# Patient Record
Sex: Male | Born: 1985 | Race: White | Hispanic: No | Marital: Married | Smoking: Current every day smoker
Health system: Southern US, Community
[De-identification: ages and names within clinical notes are randomized; demographics above are authoritative.]

## PROBLEM LIST (undated history)

## (undated) DIAGNOSIS — J45909 Unspecified asthma, uncomplicated: Secondary | ICD-10-CM

## (undated) DIAGNOSIS — I499 Cardiac arrhythmia, unspecified: Secondary | ICD-10-CM

## (undated) DIAGNOSIS — G8929 Other chronic pain: Secondary | ICD-10-CM

## (undated) DIAGNOSIS — G2581 Restless legs syndrome: Secondary | ICD-10-CM

## (undated) DIAGNOSIS — M549 Dorsalgia, unspecified: Secondary | ICD-10-CM

## (undated) HISTORY — PX: KNEE SURGERY: SHX244

## (undated) HISTORY — PX: OTHER SURGICAL HISTORY: SHX169

---
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
## 2008-10-29 ENCOUNTER — Emergency Department (HOSPITAL_COMMUNITY): Admission: EM | Admit: 2008-10-29 | Discharge: 2008-10-29 | Payer: Self-pay | Admitting: Emergency Medicine

## 2012-11-07 ENCOUNTER — Emergency Department (HOSPITAL_COMMUNITY): Payer: Self-pay

## 2012-11-07 ENCOUNTER — Emergency Department (HOSPITAL_COMMUNITY)
Admission: EM | Admit: 2012-11-07 | Discharge: 2012-11-07 | Disposition: A | Discharge status: Home / Self Care | Payer: Self-pay | Attending: Emergency Medicine | Admitting: Emergency Medicine

## 2012-11-07 ENCOUNTER — Encounter (HOSPITAL_COMMUNITY): Payer: Self-pay | Admitting: *Deleted

## 2012-11-07 DIAGNOSIS — S298XXA Other specified injuries of thorax, initial encounter: Secondary | ICD-10-CM | POA: Insufficient documentation

## 2012-11-07 DIAGNOSIS — S0083XA Contusion of other part of head, initial encounter: Secondary | ICD-10-CM

## 2012-11-07 DIAGNOSIS — S022XXA Fracture of nasal bones, initial encounter for closed fracture: Secondary | ICD-10-CM | POA: Insufficient documentation

## 2012-11-07 DIAGNOSIS — S0003XA Contusion of scalp, initial encounter: Secondary | ICD-10-CM | POA: Insufficient documentation

## 2012-11-07 HISTORY — DX: Unspecified asthma, uncomplicated: J45.909

## 2012-11-07 MED ORDER — HYDROCODONE-ACETAMINOPHEN 5-325 MG PO TABS
1.0000 | ORAL_TABLET | Freq: Once | ORAL | Status: AC
  Administered 2012-11-07: 1 via ORAL
  Filled 2012-11-07: qty 1

## 2012-11-07 MED ORDER — HYDROCODONE-ACETAMINOPHEN 5-325 MG PO TABS: 2.0000 | ORAL_TABLET | ORAL | Status: DC | PRN

## 2012-11-07 MED ORDER — NAPROXEN 375 MG PO TABS: 375.0000 mg | ORAL_TABLET | Freq: Two times a day (BID) | ORAL | Status: DC
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
## 2012-11-07 NOTE — ED Notes (Signed)
Pt back from xray, dressed self. Re-placing jewelry back in piercing holes. Ambulatory in room.
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
## 2012-11-07 NOTE — ED Notes (Signed)
Pt to xray via wc

## 2013-01-31 ENCOUNTER — Emergency Department (HOSPITAL_COMMUNITY)
Admission: EM | Admit: 2013-01-31 | Discharge: 2013-01-31 | Disposition: A | Discharge status: Home / Self Care | Payer: Self-pay | Attending: Emergency Medicine | Admitting: Emergency Medicine

## 2013-01-31 ENCOUNTER — Encounter (HOSPITAL_COMMUNITY): Payer: Self-pay

## 2013-01-31 DIAGNOSIS — J45909 Unspecified asthma, uncomplicated: Secondary | ICD-10-CM | POA: Insufficient documentation

## 2013-01-31 DIAGNOSIS — IMO0002 Reserved for concepts with insufficient information to code with codable children: Secondary | ICD-10-CM | POA: Insufficient documentation

## 2013-01-31 DIAGNOSIS — Y9289 Other specified places as the place of occurrence of the external cause: Secondary | ICD-10-CM | POA: Insufficient documentation

## 2013-01-31 DIAGNOSIS — S3992XA Unspecified injury of lower back, initial encounter: Secondary | ICD-10-CM

## 2013-01-31 DIAGNOSIS — R209 Unspecified disturbances of skin sensation: Secondary | ICD-10-CM | POA: Insufficient documentation

## 2013-01-31 DIAGNOSIS — X503XXA Overexertion from repetitive movements, initial encounter: Secondary | ICD-10-CM | POA: Insufficient documentation

## 2013-01-31 DIAGNOSIS — Y93H1 Activity, digging, shoveling and raking: Secondary | ICD-10-CM | POA: Insufficient documentation

## 2013-01-31 DIAGNOSIS — F17200 Nicotine dependence, unspecified, uncomplicated: Secondary | ICD-10-CM | POA: Insufficient documentation

## 2013-01-31 MED ORDER — CYCLOBENZAPRINE HCL 10 MG PO TABS: 10.0000 mg | ORAL_TABLET | Freq: Two times a day (BID) | ORAL | Status: DC | PRN

## 2013-01-31 MED ORDER — TRAMADOL HCL 50 MG PO TABS: 50.0000 mg | ORAL_TABLET | Freq: Four times a day (QID) | ORAL | Status: DC | PRN

## 2013-01-31 NOTE — ED Provider Notes (Signed)
History     CSN: [id number]  Arrival date & time 01/31/13  1722   First MD Initiated Contact with Patient 01/31/13 1801      Chief Complaint  Patient presents with  . Back Pain    (Consider location/radiation/quality/duration/timing/severity/associated sxs/prior treatment) Patient is a 27 y.o. male presenting with back pain. The history is provided by the patient.  Back Pain Location:  Lumbar spine Radiates to:  R thigh Pain severity:  Severe Pain is:  Same all the time Onset quality:  Sudden Duration:  9 days Timing:  Intermittent Progression:  Worsening Chronicity:  New Context: physical stress   Context comment:  Digging a drain  Relieved by:  Nothing Worsened by:  Ambulation, movement and twisting Ineffective treatments:  NSAIDs Associated symptoms: tingling   Associated symptoms: no abdominal pain, no bladder incontinence, no bowel incontinence, no chest pain, no fever, no perianal numbness and no weakness    Oscar Anderson is a 27 y.o. male who presents to the ED with low back pain. The pain started over a week ago after digging a ditch for a drain pipe. The pain radiates to the right buttock and right leg.  Past Medical History  Diagnosis Date  . Asthma     History reviewed. No pertinent past surgical history.  No family history on file.  History  Substance Use Topics  . Smoking status: Current Every Day Smoker    Types: Cigarettes  . Smokeless tobacco: Not on file  . Alcohol Use: Yes     Comment: occ      Review of Systems  Constitutional: Negative for fever and chills.  HENT: Negative for neck pain.   Respiratory: Negative for shortness of breath.   Cardiovascular: Negative for chest pain.  Gastrointestinal: Negative for abdominal pain and bowel incontinence.  Genitourinary: Negative for bladder incontinence.  Musculoskeletal: Positive for back pain.  Skin: Negative for wound.  Neurological: Positive for tingling. Negative for weakness.   Psychiatric/Behavioral: The patient is not nervous/anxious.     Allergies  Review of patient's allergies indicates no known allergies.  Home Medications   Current Outpatient Rx  Name  Route  Sig  Dispense  Refill  . ibuprofen (ADVIL,MOTRIN) 200 MG tablet   Oral   Take 200 mg by mouth every 6 (six) hours as needed for pain.           BP 129/77  Pulse 95  Temp(Src) 97.9 F (36.6 C) (Oral)  Resp 20  Ht 5\' 7"  (1.702 m)  Wt 167 lb (75.751 kg)  BMI 26.15 kg/m2  SpO2 100%  Physical Exam  Nursing note and vitals reviewed. Constitutional: He is oriented to person, place, and time. He appears well-developed and well-nourished. No distress.  HENT:  Head: Normocephalic and atraumatic.  Eyes: Conjunctivae and EOM are normal.  Neck: Normal range of motion. Neck supple.  Cardiovascular: Normal rate, regular rhythm and normal heart sounds.   Pulmonary/Chest: Effort normal and breath sounds normal. He has no wheezes.  Abdominal: Soft. There is no tenderness.  Musculoskeletal:       Lumbar back: He exhibits decreased range of motion, tenderness and spasm.       Back:  Tender with palpation right lower lumbar area and right sciatic nerve. Pain radiates to right upper leg.   Neurological: He is alert and oriented to person, place, and time. He has normal strength and normal reflexes. No cranial nerve deficit or sensory deficit.  Radial and pedal pulses strong  and equal bilateral. Good touch sensation, adequate circulation. Ambulatory without difficulty.  Skin: Skin is warm and dry.  Psychiatric: He has a normal mood and affect. His behavior is normal.    ED Course  Procedures (including critical care time)  Assessment: 27 y.o. male with low back pain   Lumbar strain, muscle spasm  Plan:  Muscle relaxant, pain management, follow up with ortho prn.  MDM  Discussed with the patient clinical findings and plan of care and all questioned fully answered. He will return if any problems  arise.    Medication List    TAKE these medications       cyclobenzaprine 10 MG tablet  Commonly known as:  FLEXERIL  Take 1 tablet (10 mg total) by mouth 2 (two) times daily as needed for muscle spasms.     traMADol 50 MG tablet  Commonly known as:  ULTRAM  Take 1 tablet (50 mg total) by mouth every 6 (six) hours as needed for pain.      ASK your doctor about these medications       ibuprofen 200 MG tablet  Commonly known as:  ADVIL,MOTRIN  Take 200 mg by mouth every 6 (six) hours as needed for pain.               Janne Napoleon, [place] 01/31/13 [phone]

## 2013-01-31 NOTE — ED Notes (Signed)
Pt reports low back pain for 1 week, describes as hot lava running down his back and into right leg

## 2013-02-03 NOTE — ED Provider Notes (Signed)
Medical screening examination/treatment/procedure(s) were performed by non-physician practitioner and as supervising physician I was immediately available for consultation/collaboration.  Raeford Razor, MD 02/03/13 [phone]

## 2013-06-18 ENCOUNTER — Encounter (HOSPITAL_COMMUNITY): Payer: Self-pay | Admitting: Emergency Medicine

## 2013-06-18 ENCOUNTER — Emergency Department (HOSPITAL_COMMUNITY)
Admission: EM | Admit: 2013-06-18 | Discharge: 2013-06-18 | Disposition: A | Discharge status: Home / Self Care | Payer: Self-pay | Attending: Emergency Medicine | Admitting: Emergency Medicine

## 2013-06-18 DIAGNOSIS — IMO0002 Reserved for concepts with insufficient information to code with codable children: Secondary | ICD-10-CM | POA: Insufficient documentation

## 2013-06-18 DIAGNOSIS — M549 Dorsalgia, unspecified: Secondary | ICD-10-CM

## 2013-06-18 DIAGNOSIS — M545 Low back pain, unspecified: Secondary | ICD-10-CM | POA: Insufficient documentation

## 2013-06-18 DIAGNOSIS — F17200 Nicotine dependence, unspecified, uncomplicated: Secondary | ICD-10-CM | POA: Insufficient documentation

## 2013-06-18 DIAGNOSIS — R2232 Localized swelling, mass and lump, left upper limb: Secondary | ICD-10-CM

## 2013-06-18 DIAGNOSIS — J45909 Unspecified asthma, uncomplicated: Secondary | ICD-10-CM | POA: Insufficient documentation

## 2013-06-18 MED ORDER — CYCLOBENZAPRINE HCL 5 MG PO TABS: 5.0000 mg | ORAL_TABLET | Freq: Three times a day (TID) | ORAL | Status: DC | PRN

## 2013-06-18 MED ORDER — CEPHALEXIN 500 MG PO CAPS: 500.0000 mg | ORAL_CAPSULE | Freq: Four times a day (QID) | ORAL | Status: DC

## 2013-06-18 MED ORDER — HYDROCODONE-ACETAMINOPHEN 5-325 MG PO TABS
1.0000 | ORAL_TABLET | Freq: Once | ORAL | Status: AC
  Administered 2013-06-18: 1 via ORAL
  Filled 2013-06-18: qty 1

## 2013-06-18 MED ORDER — HYDROCODONE-ACETAMINOPHEN 5-325 MG PO TABS: 1.0000 | ORAL_TABLET | ORAL | Status: DC | PRN

## 2013-06-18 NOTE — ED Provider Notes (Signed)
CSN: [id number]     Arrival date & time 06/18/13  1531 History   This chart was scribed for non-physician practitioner Coral Ceo, PA-C working with Lyanne Co, MD by Joaquin Music and Dorothey Baseman, ED Scribe. This patient was seen in room WTR7/WTR7 and the patient's care was started at 5:31 PM .   Chief Complaint  Patient presents with  . Back Pain  . Abscess   The history is provided by the patient. No language interpreter was used.   HPI Comments: Oscar Anderson is a 27 y.o. male who presents to the Emergency Department complaining of back pain and an abscess.  His back pain has been present for two weeks after removing drain pipes from the ground two weeks ago at work.  No trauma or injuries.  His pain is located in his lower back bilaterally.  His pain radiates down his legs bilaterally.  His pain is gradually worsening.  He states he has had similar pain when he pulled a muscle in his back two years ago.  He describes it has a burning pain worse with movement.  He has been trying warm compresses and Tylenol with no relief.  He denies any loss of bowel/bladder function, numbness, loss of sensation, or weakness.  He is worried about his pain interfering with his job.  Pt also complains of abscess under left axilla onset 6 days. Pt states abscess seems to be growing. Pt denies any wounds in area prior to abscess. Pt denies being diabetic. He denies abscess drainage. Pt states this is the first abscess he has ever had.  He denies any lacerations to his arms or recent tattoos.  He otherwise has been well with no recent fevers, chills, change in appetite/activity, rhinorrhea, congestion, sore throat, chest pain, SOB, abdominal pain, nausea, emesis, dysuria, headache, dizziness, or lightheadedness.     Past Medical History  Diagnosis Date  . Asthma    History reviewed. No pertinent past surgical history. No family history on file. History  Substance Use Topics  . Smoking  status: Current Every Day Smoker    Types: Cigarettes  . Smokeless tobacco: Not on file  . Alcohol Use: Yes     Comment: occ    Review of Systems  Constitutional: Negative for fever, chills, activity change, appetite change and fatigue.  HENT: Negative for ear pain, congestion, sore throat, rhinorrhea, neck pain and neck stiffness.   Eyes: Negative for visual disturbance.  Respiratory: Negative for cough and shortness of breath.   Cardiovascular: Negative for chest pain and leg swelling.  Gastrointestinal: Negative for nausea, vomiting, abdominal pain, diarrhea and constipation.  Genitourinary: Negative for dysuria and hematuria.  Musculoskeletal: Positive for back pain. Negative for myalgias, joint swelling, arthralgias and gait problem.  Skin: Negative for rash and wound.  Neurological: Negative for dizziness, syncope, weakness, light-headedness, numbness and headaches.  Psychiatric/Behavioral: Negative for confusion.    Allergies  Flexeril and Tramadol  Home Medications   Current Outpatient Rx  Name  Route  Sig  Dispense  Refill  . acetaminophen (TYLENOL) 500 MG tablet   Oral   Take 1,000 mg by mouth every 6 (six) hours as needed for pain.          BP 130/86  Pulse 68  Temp(Src) 97.5 F (36.4 C) (Oral)  Resp 18  SpO2 100%  Filed Vitals:   06/18/13 1619  BP: 130/86  Pulse: 68  Temp: 97.5 F (36.4 C)  TempSrc: Oral  Resp: 18  SpO2: 100%     Physical Exam  Nursing note and vitals reviewed. Constitutional: He is oriented to person, place, and time. He appears well-developed and well-nourished. No distress.  HENT:  Head: Normocephalic and atraumatic.  Right Ear: External ear normal.  Left Ear: External ear normal.  Nose: Nose normal.  Eyes: Conjunctivae and EOM are normal. Right eye exhibits no discharge. Left eye exhibits no discharge.  Neck: Normal range of motion. Neck supple. No tracheal deviation present.  No cervical spinal or paraspinal tenderness.    Cardiovascular: Normal rate, regular rhythm, normal heart sounds and intact distal pulses.  Exam reveals no gallop and no friction rub.   No murmur heard. Radial and dorsalis pedis pulse present bilaterally  Pulmonary/Chest: Effort normal and breath sounds normal. No respiratory distress. He has no wheezes. He has no rales.  Abdominal: Soft. Bowel sounds are normal. He exhibits no distension. There is no tenderness.  Musculoskeletal: Normal range of motion. He exhibits no edema and no tenderness.  Tenderness to palpation to the lower paraspinal muscles with no lumbar or thoracic spinal tenderness. Patient able to ambulate without difficulty or ataxia. Strength 5/5 in the UE and LE bilaterally  Neurological: He is alert and oriented to person, place, and time.  Sensation intact in the LE bilaterally. Patellar reflexes intact bilaterally  Skin: Skin is warm and dry. He is not diaphoretic.  2 cm x 1 cm fluctuant mobile circular mass in the left axilla with no overlying erythema, wounds, or ecchymosis. No wounds present to the left arm throughout  Psychiatric: He has a normal mood and affect. His behavior is normal.    ED Course  Procedures  DIAGNOSTIC STUDIES: Oxygen Saturation is 100% on RA, normal by my interpretation.    COORDINATION OF CARE: 5:36 PM-Discussed treatment plan which includes prescribing pain medication. Will do further evaluation with ultrasound.  with pt at bedside and pt agreed to plan.   5:40 PM-Ultrasound to left axilla. Appears to be an abscess however it is not definitive.    5:45 PM-Administer pain medication.  INCISION AND DRAINAGE Performed by: Coral Ceo, PA-C Consent: Verbal consent obtained. Risks and benefits: risks, benefits and alternatives were discussed Type: abscess Body area: left axilla  Anesthesia: local infiltration Incision was made with a scalpel. Local anesthetic: lidocaine 1% without epinephrine Anesthetic total: 1 ml Complexity:  simple Blunt dissection to break up loculations Drainage: N/A Drainage amount: None Patient tolerance: Patient tolerated the procedure well with no immediate complications.   Labs Review Labs Reviewed - No data to display Imaging Review No results found.  MDM  No diagnosis found.   Oscar Anderson is a 28 y.o. male who presents to the Emergency Department complaining of back pain and an abscess. Vicodin ordered for pain.      Etiology of back pain is possibly due to a muscular strain.  Patient was neurovascularly intact.  Patient was prescribed Flexeril for outpatient management.  He was instructed not to drive, drink alcohol, or operate machinery while taking this medication. Etiology of axillary mass is an enlarged lymph node vs abscess.  Bedside US performed and did not definitively differentiate. Patient was prescribed Keflex and vicodin for further management.  Patient was provided resources and instructed to follow-up with a PCP in two days for recheck of his mass and further management of his back pain.  He was instructed to keep the area clean and dry.  Patient was afebrile and remained in no acute distress throughout  his ED visit.  Patient was instructed to return to the ED if they experience any fever, spreading edema/erythema, weakness, loss of bowel/bladder function, or other concerns.  Patient was in agreement with discharge and plan.  Step dad in ED driving him home.     Final impressions: 1. Back pain  2. Mass left axilla     Luiz Iron PA-C   This patient was discussed with Dr. Gwendolyn Grant    I personally performed the services described in this documentation, which was scribed in my presence. The recorded information has been reviewed and is accurate.    Jillyn Ledger, PA-C 06/19/13 2020

## 2013-06-18 NOTE — ED Notes (Signed)
Pt presents with c/o of back pain that began two weeks ago due to removing drain pipes out of the ground. Pt reports the pain radiates down the right leg. Pt also c/o of left axilla abscess. Pt is A/O x4 and in NAD.

## 2013-06-20 NOTE — ED Provider Notes (Signed)
Medical screening examination/treatment/procedure(s) were performed by non-physician practitioner and as supervising physician I was immediately available for consultation/collaboration.  Arnie Clingenpeel M Kain Milosevic, MD 06/20/13 1510 

## 2013-09-02 ENCOUNTER — Emergency Department (HOSPITAL_COMMUNITY): Payer: Self-pay

## 2013-09-02 ENCOUNTER — Emergency Department (HOSPITAL_COMMUNITY)
Admission: EM | Admit: 2013-09-02 | Discharge: 2013-09-03 | Disposition: A | Discharge status: Home / Self Care | Payer: Self-pay | Attending: Emergency Medicine | Admitting: Emergency Medicine

## 2013-09-02 ENCOUNTER — Encounter (HOSPITAL_COMMUNITY): Payer: Self-pay | Admitting: Emergency Medicine

## 2013-09-02 DIAGNOSIS — R109 Unspecified abdominal pain: Secondary | ICD-10-CM

## 2013-09-02 DIAGNOSIS — F17200 Nicotine dependence, unspecified, uncomplicated: Secondary | ICD-10-CM | POA: Insufficient documentation

## 2013-09-02 DIAGNOSIS — R1031 Right lower quadrant pain: Secondary | ICD-10-CM | POA: Insufficient documentation

## 2013-09-02 DIAGNOSIS — R112 Nausea with vomiting, unspecified: Secondary | ICD-10-CM | POA: Insufficient documentation

## 2013-09-02 DIAGNOSIS — J45909 Unspecified asthma, uncomplicated: Secondary | ICD-10-CM | POA: Insufficient documentation

## 2013-09-02 LAB — CBC WITH DIFFERENTIAL/PLATELET
Basophils Relative: 0 % (ref 0–1)
Eosinophils Absolute: 0.1 10*3/uL (ref 0.0–0.7)
Eosinophils Relative: 0 % (ref 0–5)
HCT: 42.9 % (ref 39.0–52.0)
Hemoglobin: 15.5 g/dL (ref 13.0–17.0)
MCH: 31.6 pg (ref 26.0–34.0)
MCHC: 36.1 g/dL — ABNORMAL HIGH (ref 30.0–36.0)
Monocytes Absolute: 1.1 10*3/uL — ABNORMAL HIGH (ref 0.1–1.0)
Monocytes Relative: 9 % (ref 3–12)
RDW: 12.2 % (ref 11.5–15.5)

## 2013-09-02 LAB — URINALYSIS, ROUTINE W REFLEX MICROSCOPIC
Glucose, UA: NEGATIVE mg/dL
Leukocytes, UA: NEGATIVE
Protein, ur: NEGATIVE mg/dL
Specific Gravity, Urine: 1.019 (ref 1.005–1.030)
pH: 6 (ref 5.0–8.0)

## 2013-09-02 LAB — COMPREHENSIVE METABOLIC PANEL
Albumin: 4.7 g/dL (ref 3.5–5.2)
BUN: 14 mg/dL (ref 6–23)
Calcium: 9.4 mg/dL (ref 8.4–10.5)
Creatinine, Ser: 0.93 mg/dL (ref 0.50–1.35)
Total Bilirubin: 0.3 mg/dL (ref 0.3–1.2)
Total Protein: 7.6 g/dL (ref 6.0–8.3)

## 2013-09-02 MED ORDER — SODIUM CHLORIDE 0.9 % IV BOLUS (SEPSIS)
1000.0000 mL | Freq: Once | INTRAVENOUS | Status: AC
  Administered 2013-09-02: 1000 mL via INTRAVENOUS

## 2013-09-02 MED ORDER — ONDANSETRON HCL 4 MG/2ML IJ SOLN
4.0000 mg | Freq: Once | INTRAMUSCULAR | Status: AC
  Administered 2013-09-02: 4 mg via INTRAVENOUS
  Filled 2013-09-02: qty 2

## 2013-09-02 MED ORDER — HYDROMORPHONE HCL PF 1 MG/ML IJ SOLN
1.0000 mg | Freq: Once | INTRAMUSCULAR | Status: AC
  Administered 2013-09-02: 1 mg via INTRAVENOUS
  Filled 2013-09-02: qty 1

## 2013-09-02 MED ORDER — IOHEXOL 300 MG/ML  SOLN
20.0000 mL | INTRAMUSCULAR | Status: AC
  Administered 2013-09-02: 20 mL via ORAL
  Administered 2013-09-02: 25 mL via ORAL

## 2013-09-02 MED ORDER — IOHEXOL 300 MG/ML  SOLN
100.0000 mL | Freq: Once | INTRAMUSCULAR | Status: AC | PRN
  Administered 2013-09-02: 100 mL via INTRAVENOUS

## 2013-09-02 NOTE — ED Notes (Signed)
Pt states he is having lower right abd pain that is made worse with palpation.  Pt states pain is made worse with urination.

## 2013-09-02 NOTE — ED Provider Notes (Signed)
CSN: [id number]     Arrival date & time 09/02/13  1847 History   First MD Initiated Contact with Patient 09/02/13 2100     Chief Complaint  Patient presents with  . Abdominal Pain   (Consider location/radiation/quality/duration/timing/severity/associated sxs/prior Treatment) Patient is a 27 y.o. male presenting with abdominal pain.  Abdominal Pain Associated symptoms: nausea and vomiting   Associated symptoms: no chills, no constipation, no cough, no diarrhea, no dysuria, no fever, no hematuria and no sore throat    Oscar Anderson is a 27 y.o. male who presents to the emergency department for concern of abdominal pain.  R sided.  Located in RLQ.  Radiates towards R back.  Started two days ago.  Worse with palpation.  Better with nothing.  Associated with some dark urine and subjective fevers. One episode of NBNB vomiting.  No diarrhea/constipation.  No other symptoms.  Past Medical History  Diagnosis Date  . Asthma    History reviewed. No pertinent past surgical history. History reviewed. No pertinent family history. History  Substance Use Topics  . Smoking status: Current Every Day Smoker -- 0.50 packs/day    Types: Cigarettes  . Smokeless tobacco: Not on file  . Alcohol Use: No     Comment: occ    Review of Systems  Constitutional: Negative for fever and chills.  HENT: Negative for congestion and sore throat.   Respiratory: Negative for cough.   Gastrointestinal: Positive for nausea, vomiting and abdominal pain. Negative for diarrhea and constipation.  Endocrine: Negative for polyuria.  Genitourinary: Negative for dysuria and hematuria.  Musculoskeletal: Negative for neck pain.  Skin: Negative for rash.  Neurological: Negative for headaches.  Psychiatric/Behavioral: Negative.   All other systems reviewed and are negative.    Allergies  Flexeril and Tramadol  Home Medications  No current outpatient prescriptions on file. BP 123/71  Pulse 67  Temp(Src)  98.5 F (36.9 C) (Oral)  Resp 17  Wt 142 lb 4 oz (64.524 kg)  SpO2 99% Physical Exam  Nursing note and vitals reviewed. Constitutional: He is oriented to person, place, and time. He appears well-developed and well-nourished. No distress.  HENT:  Head: Normocephalic and atraumatic.  Right Ear: External ear normal.  Left Ear: External ear normal.  Mouth/Throat: Oropharynx is clear and moist. No oropharyngeal exudate.  Eyes: Conjunctivae are normal. Pupils are equal, round, and reactive to light. Right eye exhibits no discharge.  Neck: Normal range of motion. Neck supple. No tracheal deviation present.  Cardiovascular: Normal rate, regular rhythm and intact distal pulses.   Pulmonary/Chest: Effort normal. No respiratory distress. He has no wheezes. He has no rales.  Abdominal: Soft. He exhibits no distension. There is no tenderness. There is no rebound and no guarding.  Musculoskeletal: Normal range of motion.  Neurological: He is alert and oriented to person, place, and time.  Skin: Skin is warm and dry. No rash noted. He is not diaphoretic.  Psychiatric: He has a normal mood and affect.    ED Course  Procedures (including critical care time) Labs Review Labs Reviewed  CBC WITH DIFFERENTIAL - Abnormal; Notable for the following:    WBC 11.4 (*)    MCHC 36.1 (*)    Monocytes Absolute 1.1 (*)    All other components within normal limits  COMPREHENSIVE METABOLIC PANEL  URINALYSIS, ROUTINE W REFLEX MICROSCOPIC   Imaging Review No results found.  EKG Interpretation   None       MDM   1. Abdominal pain  Oscar Anderson is a 27 y.o. male who presents to the emergency department for concern of R side abdominal pain.  Pain controlled.  Basic labs with mild leukocytosis.  CT scan performed and negative for acute process.  Patient reevaluated and with improvement in abdominal pain.  Doubt biliary cause of pain.  Patient safe for discharge home.  Patient  discharged.    Arloa Koh, MD 09/02/13 [phone]

## 2013-09-03 NOTE — ED Notes (Signed)
Pt ambulating all over room, upset that dr did not prescribe pain medication. Explained to pt that all scans and testing was negative and it was recommended that he follow up with a primary care physician for further evaluation. Pt states understanding but still upset.

## 2013-09-03 NOTE — ED Provider Notes (Signed)
I saw and evaluated the patient, reviewed the resident's note and I agree with the findings and plan.  EKG Interpretation   None        Patient here with abdominal pain. RLQ pain on exam, no rebound, guarding. Will CT. CT negative. Stable for discharge.  Dagmar Hait, MD 09/03/13 [phone]

## 2014-06-27 ENCOUNTER — Encounter (HOSPITAL_BASED_OUTPATIENT_CLINIC_OR_DEPARTMENT_OTHER): Payer: Self-pay | Admitting: Emergency Medicine

## 2014-06-27 ENCOUNTER — Emergency Department (HOSPITAL_BASED_OUTPATIENT_CLINIC_OR_DEPARTMENT_OTHER): Payer: Self-pay

## 2014-06-27 ENCOUNTER — Emergency Department (HOSPITAL_BASED_OUTPATIENT_CLINIC_OR_DEPARTMENT_OTHER)
Admission: EM | Admit: 2014-06-27 | Discharge: 2014-06-27 | Disposition: A | Discharge status: Home / Self Care | Payer: Self-pay | Attending: Emergency Medicine | Admitting: Emergency Medicine

## 2014-06-27 DIAGNOSIS — R0789 Other chest pain: Secondary | ICD-10-CM | POA: Insufficient documentation

## 2014-06-27 DIAGNOSIS — J45909 Unspecified asthma, uncomplicated: Secondary | ICD-10-CM | POA: Insufficient documentation

## 2014-06-27 DIAGNOSIS — Z720 Tobacco use: Secondary | ICD-10-CM | POA: Insufficient documentation

## 2014-06-27 LAB — URINALYSIS, ROUTINE W REFLEX MICROSCOPIC
Bilirubin Urine: NEGATIVE
GLUCOSE, UA: NEGATIVE mg/dL
HGB URINE DIPSTICK: NEGATIVE
KETONES UR: NEGATIVE mg/dL
LEUKOCYTES UA: NEGATIVE
Nitrite: NEGATIVE
PH: 6.5 (ref 5.0–8.0)
PROTEIN: NEGATIVE mg/dL
Specific Gravity, Urine: 1.003 — ABNORMAL LOW (ref 1.005–1.030)
Urobilinogen, UA: 0.2 mg/dL (ref 0.0–1.0)

## 2014-06-27 LAB — CBC
HEMATOCRIT: 43.9 % (ref 39.0–52.0)
HEMOGLOBIN: 15.5 g/dL (ref 13.0–17.0)
MCH: 30.8 pg (ref 26.0–34.0)
MCHC: 35.3 g/dL (ref 30.0–36.0)
MCV: 87.1 fL (ref 78.0–100.0)
Platelets: 321 10*3/uL (ref 150–400)
RBC: 5.04 MIL/uL (ref 4.22–5.81)
RDW: 11.8 % (ref 11.5–15.5)
WBC: 7.7 10*3/uL (ref 4.0–10.5)

## 2014-06-27 LAB — BASIC METABOLIC PANEL
ANION GAP: 13 (ref 5–15)
BUN: 8 mg/dL (ref 6–23)
CALCIUM: 9.6 mg/dL (ref 8.4–10.5)
CHLORIDE: 102 meq/L (ref 96–112)
CO2: 27 meq/L (ref 19–32)
Creatinine, Ser: 0.8 mg/dL (ref 0.50–1.35)
GFR calc Af Amer: >90 mL/min (ref >90)
GFR calc non Af Amer: >90 mL/min (ref >90)
GLUCOSE: 93 mg/dL (ref 70–99)
Potassium: 4.3 mEq/L (ref 3.7–5.3)
SODIUM: 142 meq/L (ref 137–147)

## 2014-06-27 LAB — TROPONIN I: Troponin I: <0.3 ng/mL (ref <0.30)

## 2014-06-27 MED ORDER — HYDROCODONE-ACETAMINOPHEN 5-325 MG PO TABS: 1.0000 | ORAL_TABLET | Freq: Four times a day (QID) | ORAL | Status: DC | PRN

## 2014-06-27 MED ORDER — LORAZEPAM 1 MG PO TABS: 1.0000 mg | ORAL_TABLET | Freq: Three times a day (TID) | ORAL | Status: DC | PRN

## 2014-06-27 MED ORDER — IBUPROFEN 800 MG PO TABS: 800.0000 mg | ORAL_TABLET | Freq: Three times a day (TID) | ORAL | Status: DC

## 2014-06-27 MED ORDER — HYDROCODONE-ACETAMINOPHEN 5-325 MG PO TABS
1.0000 | ORAL_TABLET | Freq: Once | ORAL | Status: AC
  Administered 2014-06-27: 1 via ORAL
  Filled 2014-06-27: qty 1

## 2014-06-27 MED ORDER — LORAZEPAM 1 MG PO TABS
1.0000 mg | ORAL_TABLET | Freq: Once | ORAL | Status: AC
  Administered 2014-06-27: 1 mg via ORAL
  Filled 2014-06-27: qty 1

## 2014-06-27 NOTE — Discharge Instructions (Signed)
Please follow up with your primary care physician in 1-2 days. If you do not have one please call the [facility] number listed above. Please take pain medication and/or muscle relaxants as prescribed and as needed for pain. Please do not drive on narcotic pain medication or on muscle relaxants. Please read all discharge instructions and return precautions.    Chest Wall Pain Chest wall pain is pain in or around the bones and muscles of your chest. It may take up to 6 weeks to get better. It may take longer if you must stay physically active in your work and activities.  CAUSES  Chest wall pain may happen on its own. However, it may be caused by:  A viral illness like the flu.  Injury.  Coughing.  Exercise.  Arthritis.  Fibromyalgia.  Shingles. HOME CARE INSTRUCTIONS   Avoid overtiring physical activity. Try not to strain or perform activities that cause pain. This includes any activities using your chest or your abdominal and side muscles, especially if heavy weights are used.  Put ice on the sore area.  Put ice in a plastic bag.  Place a towel between your skin and the bag.  Leave the ice on for 15-20 minutes per hour while awake for the first 2 days.  Only take over-the-counter or prescription medicines for pain, discomfort, or fever as directed by your caregiver. SEEK IMMEDIATE MEDICAL CARE IF:   Your pain increases, or you are very uncomfortable.  You have a fever.  Your chest pain becomes worse.  You have new, unexplained symptoms.  You have nausea or vomiting.  You feel sweaty or lightheaded.  You have a cough with phlegm (sputum), or you cough up blood. MAKE SURE YOU:   Understand these instructions.  Will watch your condition.  Will get help right away if you are not doing well or get worse. Document Released: 09/06/2005 Document Revised: 11/29/2011 Document Reviewed: 05/03/2011 [facility] Patient Information 2015 [place] [place].  This information is not intended to replace advice given to you by your health care provider. Make sure you discuss any questions you have with your health care provider. Generalized Anxiety Disorder Generalized anxiety disorder (GAD) is a mental disorder. It interferes with life functions, including relationships, work, and school. GAD is different from normal anxiety, which everyone experiences at some point in their lives in response to specific life events and activities. Normal anxiety actually helps us prepare for and get through these life events and activities. Normal anxiety goes away after the event or activity is over.  GAD causes anxiety that is not necessarily related to specific events or activities. It also causes excess anxiety in proportion to specific events or activities. The anxiety associated with GAD is also difficult to control. GAD can vary from mild to severe. People with severe GAD can have intense waves of anxiety with physical symptoms (panic attacks).  SYMPTOMS The anxiety and worry associated with GAD are difficult to control. This anxiety and worry are related to many life events and activities and also occur more days than not for 6 months or longer. People with GAD also have three or more of the following symptoms (one or more in children):  Restlessness.   Fatigue.  Difficulty concentrating.   Irritability.  Muscle tension.  Difficulty sleeping or unsatisfying sleep. DIAGNOSIS GAD is diagnosed through an assessment by your health care provider. Your health care provider will ask you questions aboutyour mood,physical symptoms, and events in your life.  Your health care provider may ask you about your medical history and use of alcohol or drugs, including prescription medicines. Your health care provider may also do a physical exam and blood tests. Certain medical conditions and the use of certain substances can cause symptoms similar to those associated with  GAD. Your health care provider may refer you to a mental health specialist for further evaluation. TREATMENT The following therapies are usually used to treat GAD:   Medication. Antidepressant medication usually is prescribed for long-term daily control. Antianxiety medicines may be added in severe cases, especially when panic attacks occur.   Talk therapy (psychotherapy). Certain types of talk therapy can be helpful in treating GAD by providing support, education, and guidance. A form of talk therapy called cognitive behavioral therapy can teach you healthy ways to think about and react to daily life events and activities.  Stress managementtechniques. These include yoga, meditation, and exercise and can be very helpful when they are practiced regularly. A mental health specialist can help determine which treatment is best for you. Some people see improvement with one therapy. However, other people require a combination of therapies. Document Released: 01/01/2013 Document Revised: 01/21/2014 Document Reviewed: 01/01/2013 [facility] Patient Information 2015 [place], [place]. This information is not intended to replace advice given to you by your health care provider. Make sure you discuss any questions you have with your health care provider.

## 2014-06-27 NOTE — ED Notes (Signed)
Tightness around his chest for a week. Admits to smoking "pot" but stopped a month ago. He is having ringing in his right ear.

## 2014-06-27 NOTE — ED Provider Notes (Signed)
CSN: [id number]     Arrival date & time 06/27/14  1402 History   First MD Initiated Contact with Patient 06/27/14 1545     Chief Complaint  Patient presents with  . Chest Pain     (Consider location/radiation/quality/duration/timing/severity/associated sxs/prior Treatment) HPI Comments: Patient is a 28 year old male past medical history significant for tobacco abuse, as presented to the emergency department for 4-5 days of constant pinpoint left lower chest sharpness. Alleviating factors: pain medication (temporarily). Aggravating factors: deep inspiration. Medications tried prior to arrival: Norco. Patient endorses he's had increased stress over the last month. History fevers, chills nausea, vomiting, abdominal pain, diarrhea, shortness of breath, cough. He denies any tinnitus to me despite the triage note. PERC negative. Patient's uncle with early onset ACS, no other early familial cardiac history.     Past Medical History  Diagnosis Date  . Asthma    History reviewed. No pertinent past surgical history. No family history on file. History  Substance Use Topics  . Smoking status: Current Every Day Smoker -- 0.50 packs/day    Types: Cigarettes  . Smokeless tobacco: Not on file  . Alcohol Use: No     Comment: occ    Review of Systems  Cardiovascular: Positive for chest pain.  All other systems reviewed and are negative.     Allergies  Flexeril and Tramadol  Home Medications   Prior to Admission medications   Medication Sig Start Date End Date Taking? Authorizing Provider  HYDROcodone-acetaminophen (NORCO/VICODIN) 5-325 MG per tablet Take 1-2 tablets by mouth every 6 (six) hours as needed for severe pain. 06/27/14   Cabe Lashley L Jasmen Emrich, PA-C  ibuprofen (ADVIL,MOTRIN) 800 MG tablet Take 1 tablet (800 mg total) by mouth 3 (three) times daily. 06/27/14   Maurita Havener L Isauro Skelley, PA-C  LORazepam (ATIVAN) 1 MG tablet Take 1 tablet (1 mg total) by mouth 3 (three) times daily as  needed for anxiety. 06/27/14   Tkeya Stencil L Marilyn Nihiser, PA-C   BP 115/68  Pulse 72  Temp(Src) 97.9 F (36.6 C) (Oral)  Resp 16  Ht 5\' 8"  (1.727 m)  Wt 157 lb (71.215 kg)  BMI 23.88 kg/m2  SpO2 98% Physical Exam  Nursing note and vitals reviewed. Constitutional: He is oriented to person, place, and time. He appears well-developed and well-nourished. No distress.  HENT:  Head: Normocephalic and atraumatic.  Right Ear: External ear normal.  Left Ear: External ear normal.  Nose: Nose normal.  Mouth/Throat: Oropharynx is clear and moist. No oropharyngeal exudate.  Eyes: Conjunctivae are normal.  Neck: Neck supple.  Cardiovascular: Normal rate, regular rhythm, normal heart sounds and intact distal pulses.   Pulmonary/Chest: Effort normal and breath sounds normal. He exhibits tenderness. He exhibits no crepitus, no deformity, no swelling and no retraction.    Abdominal: Soft. There is no tenderness.  Musculoskeletal: He exhibits no edema.  Neurological: He is alert and oriented to person, place, and time.  Skin: Skin is warm and dry. He is not diaphoretic.    ED Course  Procedures (including critical care time) Medications  LORazepam (ATIVAN) tablet 1 mg (1 mg Oral Given 06/27/14 1653)  HYDROcodone-acetaminophen (NORCO/VICODIN) 5-325 MG per tablet 1 tablet (1 tablet Oral Given 06/27/14 1752)    Labs Review Labs Reviewed  URINALYSIS, ROUTINE W REFLEX MICROSCOPIC - Abnormal; Notable for the following:    Specific Gravity, Urine 1.003 (*)    All other components within normal limits  TROPONIN I  BASIC METABOLIC PANEL  CBC    Imaging  Review Dg Chest 2 View  06/27/2014   CLINICAL DATA:  Chest pain with shortness of breath 5 days.  EXAM: CHEST  2 VIEW  COMPARISON:  11/07/2012  FINDINGS: The heart size and mediastinal contours are within normal limits. Both lungs are clear. The visualized skeletal structures are unremarkable.  IMPRESSION: No active cardiopulmonary disease.    Electronically Signed   By: Elberta Fortisaniel  Boyle M.D.   On: 06/27/2014 17:11     EKG Interpretation None      MDM   Final diagnoses:  Chest pain, atypical    Filed Vitals:   06/27/14 1651  BP: 115/68  Pulse: 72  Temp: 97.9 F (36.6 C)  Resp: 16   Afebrile, NAD, non-toxic appearing, AAOx4. Patient is to be discharged with recommendation to follow up with PCP in regards to today's hospital visit. Chest pain is not likely of cardiac or pulmonary etiology d/t presentation, perc negative, VSS, no tracheal deviation, no JVD or new murmur, RRR, breath sounds equal bilaterally, EKG without acute abnormalities, negative troponin, and negative CXR. Pt has been advised start to return to the ED is CP becomes exertional, associated with diaphoresis or nausea, radiates to left jaw/arm, worsens or becomes concerning in any way. Pt appears reliable for follow up and is agreeable to discharge. Patient is stable at time of discharge       Jeannetta EllisJennifer L Priyansh Pry, PA-C 06/27/14 2158

## 2014-06-27 NOTE — ED Notes (Signed)
D/c home with ride (pt says his stepfather is here to drive)- rx x 3 given for hydrocodone, ativan and ibuprofen- ambulatory with steady gait at d/c

## 2014-06-27 NOTE — ED Notes (Addendum)
PA at bedside.
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
## 2014-06-28 NOTE — ED Provider Notes (Signed)
Medical screening examination/treatment/procedure(s) were performed by non-physician practitioner and as supervising physician I was immediately available for consultation/collaboration.  Suede J. Mikylah Ackroyd, MD 06/28/14 0028 

## 2015-08-22 ENCOUNTER — Emergency Department (HOSPITAL_COMMUNITY): Payer: No Typology Code available for payment source

## 2015-08-22 ENCOUNTER — Encounter (HOSPITAL_COMMUNITY): Payer: Self-pay | Admitting: *Deleted

## 2015-08-22 ENCOUNTER — Emergency Department (HOSPITAL_COMMUNITY)
Admission: EM | Admit: 2015-08-22 | Discharge: 2015-08-22 | Disposition: A | Discharge status: Home / Self Care | Payer: No Typology Code available for payment source | Attending: Emergency Medicine | Admitting: Emergency Medicine

## 2015-08-22 DIAGNOSIS — Y999 Unspecified external cause status: Secondary | ICD-10-CM | POA: Insufficient documentation

## 2015-08-22 DIAGNOSIS — M549 Dorsalgia, unspecified: Secondary | ICD-10-CM

## 2015-08-22 DIAGNOSIS — J45909 Unspecified asthma, uncomplicated: Secondary | ICD-10-CM | POA: Insufficient documentation

## 2015-08-22 DIAGNOSIS — Y92410 Unspecified street and highway as the place of occurrence of the external cause: Secondary | ICD-10-CM | POA: Diagnosis not present

## 2015-08-22 DIAGNOSIS — F17210 Nicotine dependence, cigarettes, uncomplicated: Secondary | ICD-10-CM | POA: Insufficient documentation

## 2015-08-22 DIAGNOSIS — Y9389 Activity, other specified: Secondary | ICD-10-CM | POA: Insufficient documentation

## 2015-08-22 DIAGNOSIS — S3992XA Unspecified injury of lower back, initial encounter: Secondary | ICD-10-CM | POA: Insufficient documentation

## 2015-08-22 DIAGNOSIS — Z791 Long term (current) use of non-steroidal anti-inflammatories (NSAID): Secondary | ICD-10-CM | POA: Diagnosis not present

## 2015-08-22 MED ORDER — METHOCARBAMOL 500 MG PO TABS: 500.0000 mg | ORAL_TABLET | Freq: Two times a day (BID) | ORAL | Status: DC

## 2015-08-22 MED ORDER — OXYCODONE-ACETAMINOPHEN 5-325 MG PO TABS
2.0000 | ORAL_TABLET | Freq: Once | ORAL | Status: AC
  Administered 2015-08-22: 2 via ORAL
  Filled 2015-08-22: qty 2

## 2015-08-22 MED ORDER — NAPROXEN 500 MG PO TABS: 500.0000 mg | ORAL_TABLET | Freq: Two times a day (BID) | ORAL | Status: DC

## 2015-08-22 NOTE — ED Notes (Signed)
PT is in stable condition upon d/c and ambulates from ED. 
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
## 2015-08-22 NOTE — Discharge Instructions (Signed)
Take the prescribed medication as directed.   Return to the ED for new or worsening symptoms.   Back Pain, Adult Back pain is very common in adults.The cause of back pain is rarely dangerous and the pain often gets better over time.The cause of your back pain may not be known. Some common causes of back pain include:  Strain of the muscles or ligaments supporting the spine.  Wear and tear (degeneration) of the spinal disks.  Arthritis.  Direct injury to the back. For many people, back pain may return. Since back pain is rarely dangerous, most people can learn to manage this condition on their own. HOME CARE INSTRUCTIONS Watch your back pain for any changes. The following actions may help to lessen any discomfort you are feeling:  Remain active. It is stressful on your back to sit or stand in one place for long periods of time. Do not sit, drive, or stand in one place for more than 30 minutes at a time. Take short walks on even surfaces as soon as you are able.Try to increase the length of time you walk each day.  Exercise regularly as directed by your health care provider. Exercise helps your back heal faster. It also helps avoid future injury by keeping your muscles strong and flexible.  Do not stay in bed.Resting more than 1-2 days can delay your recovery.  Pay attention to your body when you bend and lift. The most comfortable positions are those that put less stress on your recovering back. Always use proper lifting techniques, including:  Bending your knees.  Keeping the load close to your body.  Avoiding twisting.  Find a comfortable position to sleep. Use a firm mattress and lie on your side with your knees slightly bent. If you lie on your back, put a pillow under your knees.  Avoid feeling anxious or stressed.Stress increases muscle tension and can worsen back pain.It is important to recognize when you are anxious or stressed and learn ways to manage it, such as with  exercise.  Take medicines only as directed by your health care provider. Over-the-counter medicines to reduce pain and inflammation are often the most helpful.Your health care provider may prescribe muscle relaxant drugs.These medicines help dull your pain so you can more quickly return to your normal activities and healthy exercise.  Apply ice to the injured area:  Put ice in a plastic bag.  Place a towel between your skin and the bag.  Leave the ice on for 20 minutes, 2-3 times a day for the first 2-3 days. After that, ice and heat may be alternated to reduce pain and spasms.  Maintain a healthy weight. Excess weight puts extra stress on your back and makes it difficult to maintain good posture. SEEK MEDICAL CARE IF:  You have pain that is not relieved with rest or medicine.  You have increasing pain going down into the legs or buttocks.  You have pain that does not improve in one week.  You have night pain.  You lose weight.  You have a fever or chills. SEEK IMMEDIATE MEDICAL CARE IF:   You develop new bowel or bladder control problems.  You have unusual weakness or numbness in your arms or legs.  You develop nausea or vomiting.  You develop abdominal pain.  You feel faint.   This information is not intended to replace advice given to you by your health care provider. Make sure you discuss any questions you have with your health  care provider.   Document Released: 09/06/2005 Document Revised: 09/27/2014 Document Reviewed: 01/08/2014 Elsevier Interactive Patient Education [employer]
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
## 2016-01-21 ENCOUNTER — Encounter: Payer: Self-pay | Admitting: Family Medicine

## 2016-01-21 ENCOUNTER — Ambulatory Visit (INDEPENDENT_AMBULATORY_CARE_PROVIDER_SITE_OTHER): Payer: Self-pay | Admitting: Family Medicine

## 2016-01-21 VITALS — BP 131/78 | HR 92 | Wt 159.0 lb

## 2016-01-21 DIAGNOSIS — F319 Bipolar disorder, unspecified: Secondary | ICD-10-CM

## 2016-01-21 DIAGNOSIS — L0591 Pilonidal cyst without abscess: Secondary | ICD-10-CM

## 2016-01-21 MED ORDER — LAMOTRIGINE 25 MG PO TABS: ORAL_TABLET | ORAL | Status: DC

## 2016-01-21 NOTE — Progress Notes (Signed)
CC: Oscar Anderson is a 30 y.o. male is here for No chief complaint on file.   Subjective: HPI:  Very pleasant 30 year old here to establish care, significant other of Oscar Anderson  On Friday night he began to have some discomfort at the top of his gluteal cleft. Every day started to get worse and he was able to palpate an abnormality that felt like a swollen dime. On Sunday pain was unbearable and he went to a local emergency room where a pilonidal cyst was excised and packed with sterile gauze. He tells me that the pain is incredibly better and only hurts to the touch now. He is been taking Bactrim twice a day without fevers, chills, sweats or rectal pain. He has had a daily bowel movement every day for the past week without any pain while defecating. He denies any change to his bowel consistency.  When he was younger he was diagnosed with bipolar type I disorder. He is to be on medication for this however has not been taking it for years due to lack of a primary care physician. He and his significant other think it would be best for him to go back on something for bipolar disorder due to anger outbursts. He denies any depression or recent manic activity. Denies any other mental disturbance   Review of Systems - General ROS: negative for - chills, fever, night sweats, weight gain or weight loss Ophthalmic ROS: negative for - decreased vision Psychological ROS: negative for - anxiety or depression ENT ROS: negative for - hearing change, nasal congestion, tinnitus or allergies Hematological and Lymphatic ROS: negative for - bleeding problems, bruising or swollen lymph nodes Breast ROS: negative Respiratory ROS: no cough, shortness of breath, or wheezing Cardiovascular ROS: no chest pain or dyspnea on exertion Gastrointestinal ROS: no abdominal pain, change in bowel habits, or black or bloody stools Genito-Urinary ROS: negative for - genital discharge, genital ulcers, incontinence or  abnormal bleeding from genitals Musculoskeletal ROS: negative for - joint pain or muscle pain Neurological ROS: negative for - headaches or memory loss Dermatological ROS: negative for lumps, mole changes, rash and skin lesion changes other than that described above  Past Medical History  Diagnosis Date  . Asthma     No past surgical history on file. No family history on file.  Social History   Social History  . Marital Status: Single    Spouse Name: N/A  . Number of Children: N/A  . Years of Education: N/A   Occupational History  . Not on file.   Social History Main Topics  . Smoking status: Current Every Day Smoker -- 0.50 packs/day    Types: Cigarettes  . Smokeless tobacco: Not on file  . Alcohol Use: No     Comment: occ  . Drug Use: Yes    Special: Marijuana     Comment: former  . Sexual Activity: Yes    Birth Control/ Protection: None   Other Topics Concern  . Not on file   Social History Narrative     Objective: BP 131/78 mmHg  Pulse 92  Wt 159 lb (72.122 kg)  Vital signs reviewed. General: Alert and Oriented, No Acute Distress HEENT: Pupils equal, round, reactive to light. Conjunctivae clear.  External ears unremarkable.  Moist mucous membranes. Lungs: Clear and comfortable work of breathing, speaking in full sentences without accessory muscle use. Cardiac: Regular rate and rhythm.  Neuro: CN II-XII grossly intact, gait normal. Extremities: No peripheral edema.  Strong  peripheral pulses.  Mental Status: No depression, anxiety, nor agitation. Logical though process. Skin: Warm and dry. At the level of the gluteal cleft involving the right side of his buttocks there is a 1 cm vertical incision without any surrounding erythema. Removal of the packing was extremely painful for him however it only lasted a second. The amount of packing that was left in the wound was approximately 1 cm no pus was noted.  Assessment & Plan: Diagnoses and all orders for this  visit:  Pilonidal cyst  Bipolar I disorder (HCC) -     lamoTRIgine (LAMICTAL) 25 MG tablet; One by mouth daily, increase daily dose by 25mg  every week until you're taking 100mg  daily.   Pilonidal cyst: Healing, discussed that I don't think he needs any further packing as long as he is able to take a Epsom salts sitz baths 3 times a day for the next 3 days. Cover the wound with gauze and tape. Instructed his significant other on how to repeat this after every sitz baths. Continue full 10 days of Bactrim   bipolar 1 disorder: Uncontrolled chronic condition, Starting taper of Lamictal. Follow-up in one month.  Return in about 4 weeks (around 02/18/2016) for mood.

## 2016-02-18 ENCOUNTER — Ambulatory Visit (INDEPENDENT_AMBULATORY_CARE_PROVIDER_SITE_OTHER): Payer: Self-pay | Admitting: Family Medicine

## 2016-02-18 ENCOUNTER — Encounter: Payer: Self-pay | Admitting: Family Medicine

## 2016-02-18 VITALS — BP 129/82 | HR 76 | Wt 162.0 lb

## 2016-02-18 DIAGNOSIS — L0591 Pilonidal cyst without abscess: Secondary | ICD-10-CM

## 2016-02-18 DIAGNOSIS — F319 Bipolar disorder, unspecified: Secondary | ICD-10-CM

## 2016-02-18 MED ORDER — ARIPIPRAZOLE 15 MG PO TABS: 15.0000 mg | ORAL_TABLET | Freq: Every day | ORAL | Status: DC

## 2016-02-18 MED ORDER — LITHIUM CARBONATE ER 300 MG PO TBCR
300.0000 mg | EXTENDED_RELEASE_TABLET | Freq: Three times a day (TID) | ORAL | Status: DC
Start: 2016-02-18 — End: 2016-02-25

## 2016-02-18 NOTE — Progress Notes (Signed)
CC: Oscar FosterChristopher M Anderson is a 30 y.o. male is here for Manic Behavior and restless   Subjective: HPI:  Follow-up bipolar disorder: Patient knows that once he started taking Lamictal he was more agitated and felt aggressive. When he went up to 50 mg daily symptoms were even worse told me he felt like he could "tear apart the whole house.": Symptoms aside a few days after he stopped taking his medication. He still wants to take something to help control his bipolar disorder. He denies any new examples manic activity other than that described above. He tells me that Seroquel in the past was intolerable due to sedative effects. He denies any other mental disturbance today.  Follow pilonidal cyst: After removing packing at his last visit he had a little bit of clear discharge but the pain went away after a few days. He denies any tenderness or skin discomfort at the top of his gluteal cleavage. He denies any skin changes elsewhere. He denies any fevers, chills, nor joint pain.   Review Of Systems Outlined In HPI  Past Medical History  Diagnosis Date  . Asthma     No past surgical history on file. No family history on file.  Social History   Social History  . Marital Status: Single    Spouse Name: N/A  . Number of Children: N/A  . Years of Education: N/A   Occupational History  . Not on file.   Social History Main Topics  . Smoking status: Current Every Day Smoker -- 0.50 packs/day    Types: Cigarettes  . Smokeless tobacco: Not on file  . Alcohol Use: No     Comment: occ  . Drug Use: Yes    Special: Marijuana     Comment: former  . Sexual Activity: Yes    Birth Control/ Protection: None   Other Topics Concern  . Not on file   Social History Narrative     Objective: BP 129/82 mmHg  Pulse 76  Wt 162 lb (73.483 kg)  Vital signs reviewed. General: Alert and Oriented, No Acute Distress HEENT: Pupils equal, round, reactive to light. Conjunctivae clear.  External ears  unremarkable.  Moist mucous membranes. Lungs: Clear and comfortable work of breathing, speaking in full sentences without accessory muscle use. Cardiac: Regular rate and rhythm.  Neuro: CN II-XII grossly intact, gait normal. Extremities: No peripheral edema.  Strong peripheral pulses.  Mental Status: No depression, anxiety, nor agitation. Logical though process. Skin: Warm and dry. Assessment & Plan: Oscar Anderson was seen today for manic behavior and restless.  Diagnoses and all orders for this visit:  Bipolar I disorder (HCC) -     lithium carbonate (LITHOBID) 300 MG CR tablet; Take 1 tablet (300 mg total) by mouth 3 (three) times daily.  Pilonidal cyst  Other orders -     Discontinue: ARIPiprazole (ABILIFY) 15 MG tablet; Take 1 tablet (15 mg total) by mouth daily.  Bipolar disorder: Uncontrolled chronic condition initially started to prescribe Abilify however he would not be able to afford this. Starting lithium with the attention getting a lithium level at his next visit in 3-4 weeks.Signs and symptoms requring emergent/urgent reevaluation were discussed with the patient.  25 minutes spent face-to-face during visit today of which at least 50% was counseling or coordinating care regarding: 1. Bipolar I disorder (HCC)   2. Pilonidal cyst     Return in about 4 weeks (around 03/17/2016) for Lithium Level.

## 2016-02-25 ENCOUNTER — Other Ambulatory Visit: Payer: Self-pay

## 2016-02-25 ENCOUNTER — Telehealth: Payer: Self-pay

## 2016-02-25 MED ORDER — LITHIUM CARBONATE 300 MG PO TABS: 300.0000 mg | ORAL_TABLET | Freq: Three times a day (TID) | ORAL | Status: DC

## 2016-02-25 MED ORDER — LITHIUM CARBONATE 300 MG PO CAPS: 300.0000 mg | ORAL_CAPSULE | Freq: Three times a day (TID) | ORAL | Status: DC

## 2016-02-25 NOTE — Telephone Encounter (Signed)
Yes that sounds like a good idea, new Rx sent to [facility] on [address].

## 2016-02-25 NOTE — Telephone Encounter (Signed)
Thayer OhmChris girlfriend called stating that the lithium CR cost $50 and the non extended release cost about $4.  Can he be switched to the regular lithium? Please advise.

## 2016-02-25 NOTE — Telephone Encounter (Signed)
Pt.notified

## 2016-03-17 ENCOUNTER — Encounter: Payer: Self-pay | Admitting: Family Medicine

## 2016-03-17 ENCOUNTER — Ambulatory Visit (INDEPENDENT_AMBULATORY_CARE_PROVIDER_SITE_OTHER): Payer: Self-pay | Admitting: Family Medicine

## 2016-03-17 VITALS — BP 141/81 | HR 91 | Wt 161.0 lb

## 2016-03-17 DIAGNOSIS — L0591 Pilonidal cyst without abscess: Secondary | ICD-10-CM

## 2016-03-17 DIAGNOSIS — R0781 Pleurodynia: Secondary | ICD-10-CM

## 2016-03-17 DIAGNOSIS — F319 Bipolar disorder, unspecified: Secondary | ICD-10-CM

## 2016-03-17 DIAGNOSIS — Z79899 Other long term (current) drug therapy: Secondary | ICD-10-CM

## 2016-03-17 DIAGNOSIS — G2581 Restless legs syndrome: Secondary | ICD-10-CM

## 2016-03-17 MED ORDER — CLINDAMYCIN HCL 300 MG PO CAPS: 300.0000 mg | ORAL_CAPSULE | Freq: Three times a day (TID) | ORAL | Status: DC

## 2016-03-17 MED ORDER — HYDROCODONE-ACETAMINOPHEN 10-325 MG PO TABS: 1.0000 | ORAL_TABLET | Freq: Three times a day (TID) | ORAL | Status: DC | PRN

## 2016-03-17 MED ORDER — ROPINIROLE HCL 0.25 MG PO TABS: 0.2500 mg | ORAL_TABLET | Freq: Three times a day (TID) | ORAL | Status: DC | PRN

## 2016-03-17 MED ORDER — LITHIUM CARBONATE 300 MG PO CAPS: 300.0000 mg | ORAL_CAPSULE | Freq: Three times a day (TID) | ORAL | Status: DC

## 2016-03-17 NOTE — Progress Notes (Signed)
CC: Oscar Anderson is a 30 y.o. male is here for No chief complaint on file.   Subjective: HPI:  Follow-up bipolar disorder: Taking lithium with 100% compliance. He and his wife are very pleased with the effect of this medication. He doesn't he feels stable and has not had any manic activity or depression. He denies any known side effects.  Since I saw him last he had a return of his pilonidal cyst. It required incision and drainage with packing twice since I saw him last. Currently is asymptomatic. He denies any drainage from the low back or the beginning of the gluteal cleavage. He denies fevers, chills or skin pain.  He tells me that his restless legs have gotten worse in the past 4 days. He tells me usually it was only bothering him at night however it is now bothering him throughout the day. It's actually caused him to fall to the floor 3 times now twice hitting his right-sided ribs. He was taking gabapentin for this in the past but didn't seem to help at all. He denies any other motor or sensory disturbances other than pain described below  He's been experiencing severe right-sided rib pain localized to the front of rib 7. He tells that he felt a cracking sensation when he fell to the floor the last 2 times. He tells me it is worse when coughing but breathing does not exacerbate any of the pain. He tells me he is constantly hurt since the onset and is described as a stabbing sensation. He denies any shortness of breath or exertional chest pain. Pain is keeping him up at night  Review Of Systems Outlined In HPI  Past Medical History  Diagnosis Date  . Asthma     No past surgical history on file. No family history on file.  Social History   Social History  . Marital Status: Single    Spouse Name: N/A  . Number of Children: N/A  . Years of Education: N/A   Occupational History  . Not on file.   Social History Main Topics  . Smoking status: Current Every Day Smoker --  0.50 packs/day    Types: Cigarettes  . Smokeless tobacco: Not on file  . Alcohol Use: No     Comment: occ  . Drug Use: Yes    Special: Marijuana     Comment: former  . Sexual Activity: Yes    Birth Control/ Protection: None   Other Topics Concern  . Not on file   Social History Narrative     Objective: BP 141/81 mmHg  Pulse 91  Wt 161 lb (73.029 kg)  General: Alert and Oriented, No Acute Distress HEENT: Pupils equal, round, reactive to light. Conjunctivae clear. Moist mucous membranes pharynx unremarkable  Lungs: Clear to auscultation bilaterally, no wheezing/ronchi/rales.  Comfortable work of breathing. Good air movement. Cardiac: Regular rate and rhythm. Normal S1/S2.  No murmurs, rubs, nor gallops.   Extremities: No peripheral edema.  Strong peripheral pulses. Constantly moving legs  Mental Status: No depression, anxiety, nor agitation. Skin: Warm and dry. Well-healed incision site with no active synovitis infection at the popliteal cleft.  Assessment & Plan: Diagnoses and all orders for this visit:  Bipolar I disorder (HCC) -     Lithium level  High risk medication use -     Lithium level  Pilonidal cyst  RLS (restless legs syndrome)  Other orders -     HYDROcodone-acetaminophen (NORCO) 10-325 MG tablet; Take 1 tablet  by mouth every 8 (eight) hours as needed for severe pain. -     rOPINIRole (REQUIP) 0.25 MG tablet; Take 1 tablet (0.25 mg total) by mouth 3 (three) times daily as needed (RLS). -     clindamycin (CLEOCIN) 300 MG capsule; Take 1 capsule (300 mg total) by mouth 3 (three) times daily. -     lithium carbonate 300 MG capsule; Take 1 capsule (300 mg total) by mouth 3 (three) times daily with meals.   Bipolar disorder: Improved he is due for a lithium level, is agreeable to getting this in the morning before taking his morning dose. Pilonidal cyst: Infection Resolved, given prescription for clindamycin to take the second he feels like the cyst might be  showing signs of early infection Restless leg syndrome: Uncontrolled chronic condition starting ropinirole. Rib pain: Start sparing use of Norco, exam is not suggestive of a pneumothorax today  Return for Pending on Lithium Level.

## 2016-04-09 ENCOUNTER — Other Ambulatory Visit: Payer: Self-pay

## 2016-04-09 MED ORDER — LITHIUM CARBONATE 300 MG PO CAPS: 300.0000 mg | ORAL_CAPSULE | Freq: Three times a day (TID) | ORAL | Status: DC

## 2016-04-12 ENCOUNTER — Telehealth: Payer: Self-pay | Admitting: Family Medicine

## 2016-04-12 DIAGNOSIS — F319 Bipolar disorder, unspecified: Secondary | ICD-10-CM

## 2016-04-12 MED ORDER — LITHIUM CARBONATE 300 MG PO CAPS: 300.0000 mg | ORAL_CAPSULE | Freq: Three times a day (TID) | ORAL | 0 refills | Status: DC

## 2016-04-12 NOTE — Telephone Encounter (Signed)
Pt.notified

## 2016-04-12 NOTE — Telephone Encounter (Signed)
Will you please let patient know that his lithium level needs to be checked before I can provide a 30 day supply of his lithium pills. I will print off a new lab slip so this can be checked, it is best for this lab to be done first thing in the morning before the morning dose of lithium. Seven-day supply of lithium and a lab slip now in your in box.

## 2016-04-19 ENCOUNTER — Telehealth: Payer: Self-pay | Admitting: Family Medicine

## 2016-04-19 MED ORDER — PREGABALIN 75 MG PO CAPS: 75.0000 mg | ORAL_CAPSULE | Freq: Two times a day (BID) | ORAL | 0 refills | Status: DC

## 2016-04-19 NOTE — Telephone Encounter (Signed)
RLS worsening, requesting Lyrica

## 2016-07-05 ENCOUNTER — Encounter: Payer: Self-pay | Admitting: Family Medicine

## 2016-08-09 ENCOUNTER — Encounter: Payer: Self-pay | Admitting: Family Medicine

## 2016-08-09 ENCOUNTER — Ambulatory Visit (INDEPENDENT_AMBULATORY_CARE_PROVIDER_SITE_OTHER): Payer: Self-pay | Admitting: Family Medicine

## 2016-08-09 ENCOUNTER — Ambulatory Visit (INDEPENDENT_AMBULATORY_CARE_PROVIDER_SITE_OTHER): Payer: Self-pay

## 2016-08-09 DIAGNOSIS — M5127 Other intervertebral disc displacement, lumbosacral region: Secondary | ICD-10-CM

## 2016-08-09 DIAGNOSIS — M545 Low back pain, unspecified: Secondary | ICD-10-CM

## 2016-08-09 DIAGNOSIS — M5126 Other intervertebral disc displacement, lumbar region: Secondary | ICD-10-CM

## 2016-08-09 DIAGNOSIS — M4807 Spinal stenosis, lumbosacral region: Secondary | ICD-10-CM

## 2016-08-09 MED ORDER — MELOXICAM 15 MG PO TABS
15.0000 mg | ORAL_TABLET | Freq: Every day | ORAL | 0 refills | Status: DC
Start: 2016-08-09 — End: 2019-02-16

## 2016-08-09 NOTE — Patient Instructions (Addendum)
Thank you for coming in today. Come back or go to the emergency room if you notice new weakness new numbness problems walking or bowel or bladder problems. Return for MRI at around 1230 today.  Use mobic for pain.  Use a heating pad and a TENS unit.  TENS UNIT: This is helpful for muscle pain and spasm.   Search and Purchase a TENS 7000 2nd edition at www.tenspros.com. It should be less than $30.     TENS unit instructions: Do not shower or bathe with the unit on Turn the unit off before removing electrodes or batteries If the electrodes lose stickiness add a drop of water to the electrodes after they are disconnected from the unit and place on plastic sheet. If you continued to have difficulty, call the TENS unit company to purchase more electrodes. Do not apply lotion on the skin area prior to use. Make sure the skin is clean and dry as this will help prolong the life of the electrodes. After use, always check skin for unusual red areas, rash or other skin difficulties. If there are any skin problems, does not apply electrodes to the same area. Never remove the electrodes from the unit by pulling the wires. Do not use the TENS unit or electrodes other than as directed. Do not change electrode placement without consultating your therapist or physician. Keep 2 fingers with between each electrode. Wear time ratio is 2:1, on to off times.    For example on for 30 minutes off for 15 minutes and then on for 30 minutes off for 15 minutes    Lumbosacral Strain Lumbosacral strain is an injury that causes pain in the lower back (lumbosacral spine). This injury usually occurs from overstretching the muscles or ligaments along your spine. A strain can affect one or more muscles or cord-like tissues that connect bones to other bones (ligaments). What are the causes? This condition may be caused by:  A hard, direct hit (blow) to the back.  Excessive stretching of the lower back muscles. This  may result from:  A fall.  Lifting something heavy.  Repetitive movements such as bending or crouching. What increases the risk? The following factors may increase your risk of getting this condition:  Participating in sports or activities that involve:  A sudden twist of the back.  Pushing or pulling motions.  Being overweight or obese.  Having poor strength and flexibility, especially tight hamstrings or weak muscles in the back or abdomen.  Having too much of a curve in the lower back.  Having a pelvis that is tilted forward. What are the signs or symptoms? The main symptom of this condition is pain in the lower back, at the site of the strain. Pain may extend (radiate) down one or both legs. How is this diagnosed? This condition is diagnosed based on:  Your symptoms.  Your medical history.  A physical exam.  Your health care provider may push on certain areas of your back to determine the source of your pain.  You may be asked to bend forward, backward, and side to side to assess the severity of your pain and your range of motion.  Imaging tests, such as:  X-rays.  MRI. How is this treated? Treatment for this condition may include:  Putting heat and cold on the affected area.  Medicines to help relieve pain and relax your muscles (muscle relaxants).  NSAIDs to help reduce swelling and discomfort. When your symptoms improve, it is important  to gradually return to your normal routine as soon as possible to reduce pain, avoid stiffness, and avoid loss of muscle strength. Generally, symptoms should improve within 6 weeks of treatment. However, recovery time varies. Follow these instructions at home: Managing pain, stiffness, and swelling  If directed, put ice on the injured area during the first 24 hours after your strain.  Put ice in a plastic bag.  Place a towel between your skin and the bag.  Leave the ice on for 20 minutes, 2-3 times a day.  If  directed, put heat on the affected area as often as told by your health care provider. Use the heat source that your health care provider recommends, such as a moist heat pack or a heating pad.  Place a towel between your skin and the heat source.  Leave the heat on for 20-30 minutes.  Remove the heat if your skin turns bright red. This is especially important if you are unable to feel pain, heat, or cold. You may have a greater risk of getting burned. Activity  Rest and return to your normal activities as told by your health care provider. Ask your health care provider what activities are safe for you.  Avoid activities that take a lot of energy for as long as told by your health care provider. General instructions  Take over-the-counter and prescription medicines only as told by your health care provider.  Donot drive or use heavy machinery while taking prescription pain medicine.  Do not use any products that contain nicotine or tobacco, such as cigarettes and e-cigarettes. If you need help quitting, ask your health care provider.  Keep all follow-up visits as told by your health care provider. This is important. How is this prevented?  Use correct form when playing sports and lifting heavy objects.  Use good posture when sitting and standing.  Maintain a healthy weight.  Sleep on a mattress with medium firmness to support your back.  Be safe and responsible while being active to avoid falls.  Do at least 150 minutes of moderate-intensity exercise each week, such as brisk walking or water aerobics. Try a form of exercise that takes stress off your back, such as swimming or stationary cycling.  Maintain physical fitness, including:  Strength.  Flexibility.  Cardiovascular fitness.  Endurance. Contact a health care provider if:  Your back pain does not improve after 6 weeks of treatment.  Your symptoms get worse. Get help right away if:  Your back pain is  severe.  You cannot stand or walk.  You have difficulty controlling when you urinate or when you have a bowel movement.  You feel nauseous or you vomit.  Your feet get very cold.  You have numbness, tingling, weakness, or problems using your arms or legs.  You develop any of the following:  Shortness of breath.  Dizziness.  Pain in your legs.  Weakness in your buttocks or legs.  Discoloration of the skin on your toes or legs. This information is not intended to replace advice given to you by your health care provider. Make sure you discuss any questions you have with your health care provider. Document Released: 06/16/2005 Document Revised: 03/26/2016 Document Reviewed: 02/08/2016 Elsevier Interactive Patient Education  2017 [employer].

## 2016-08-09 NOTE — Progress Notes (Signed)
Oscar FosterChristopher M Anderson is a 30 y.o. male who [facility] today for back pain.  He reports pain and muscle swelling on the right side of his back that started after lifting a mattess 4-5 days ago. Since then, while urinating he has had trouble initiating and maintaining his stream in the standing position; urination is easier with sitting, but his back hurts in that position. He has not had a bowel movement in 4 days; normally has one daily. He has not noticed any new weakness.  He also has had chronic back pain for the past 1.5 years that is worst around a lump along his lumbar spine. 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 He says that only vicodin helps his pain, and that taking muscle relaxants, amitriptyline, and tylenol worsen his restless leg syndrome. He has not tried physical therapy.   Past Medical History:  Diagnosis Date  . Asthma    No past surgical history on file. Social History  Substance Use Topics  . Smoking status: Current Every Day Smoker    Packs/day: 0.50    Types: Cigarettes  . Smokeless tobacco: Not on file  . Alcohol use No     Comment: occ     ROS:  As above   Medications: Current Outpatient Prescriptions  Medication Sig Dispense Refill  . meloxicam (MOBIC) 15 MG tablet Take 1 tablet (15 mg total) by mouth daily. 15 tablet 0   No current facility-administered medications for this visit.    Allergies  Allergen Reactions  . Flexeril [Cyclobenzaprine]     Causes restless leg syndrome  . Lamictal [Lamotrigine]     Worsened manic behavior  . Tramadol Nausea Only and Rash     Exam:  BP (!) 138/99   Pulse 70   Wt 166 lb (75.3 kg)   BMI 25.24 kg/m  General: Well Developed, well nourished, and in no acute distress.  Neuro/Psych: Alert and oriented x3, extra-ocular muscles intact, able to move all 4 extremities, sensation grossly intact. Skin: Warm and dry, no rashes noted.    Respiratory: Not using accessory muscles, speaking in full sentences, trachea midline.  Cardiovascular: Pulses palpable, no extremity edema. Abdomen: Does not appear distended. MSK:  Lower back: Right paraspinal lumbar muscles with focal area of swelling around thoracolumbar area; otherwise, grossly normal-appearing. Extremely sensitive to light touch diffusely across lower back.  Tender to palpation along spinal midline, although he is also diffusely tender to palpation throughout his lower back.  Lumbar pain on spinal flexion, extension, and rotation. Lower extremities with full strength, reflexes 2+, sensation intact.   No results found for this or any previous visit (from the past 48 hour(s)). No results found.  Given 80 mg depo medrol IM.  Lumbar Spine X-Ray 08/22/15: FINDINGS: There is no evidence of lumbar spine fracture. Alignment is normal. Intervertebral disc spaces are maintained. IMPRESSION: Normal lumbar spine.  Assessment and Plan: 30 y.o. male with acute-on-chronic back pain.   Acute pain most likely due to muscle strain given recent lifting activity. Given 80 mg depo medrol shot. Treat with activity modification, meloxicam and heat.  Chronic pain has been ongoing for 1 year and managed with narcotic pain medications. Normal lumbar spine x-ray 1 year ago. Unclear if acute back pain is exacerbating chronic pain. Given new onset urinary issues, constipation, and prolonged course of pain, patient will get an MRI today.   Follow up in 1 week.   Orders  Placed This Encounter  Procedures  . MR Lumbar Spine Wo Contrast    Standing Status:   Future    Standing Expiration Date:   10/09/2017    Order Specific Question:   Reason for Exam (SYMPTOM  OR DIAGNOSIS REQUIRED)    Answer:   eval lumbago and urinary sx    Order Specific Question:   [facility]?    [facility]    Order Specific Question:   What is the  patient's sedation requirement?    Answer:   No Sedation    Order Specific Question:   Does the patient have a pacemaker or implanted devices?    Answer:   No    Discussed warning signs or symptoms. Please see discharge instructions. Patient expresses understanding.

## 2016-08-10 ENCOUNTER — Telehealth: Payer: Self-pay | Admitting: Family Medicine

## 2016-08-10 DIAGNOSIS — M5416 Radiculopathy, lumbar region: Secondary | ICD-10-CM

## 2016-08-10 NOTE — Telephone Encounter (Signed)
Bulging disc is present. I have referred for lumbar epidural steroid injection. Return to clinic in about a week to discuss results and further detail.

## 2016-08-11 NOTE — Telephone Encounter (Signed)
Pts fiance notified of results and recommendations.

## 2016-08-13 ENCOUNTER — Encounter (HOSPITAL_BASED_OUTPATIENT_CLINIC_OR_DEPARTMENT_OTHER): Payer: Self-pay

## 2016-08-13 DIAGNOSIS — F17210 Nicotine dependence, cigarettes, uncomplicated: Secondary | ICD-10-CM | POA: Insufficient documentation

## 2016-08-13 DIAGNOSIS — Z5321 Procedure and treatment not carried out due to patient leaving prior to being seen by health care provider: Secondary | ICD-10-CM | POA: Insufficient documentation

## 2016-08-13 DIAGNOSIS — M549 Dorsalgia, unspecified: Secondary | ICD-10-CM | POA: Insufficient documentation

## 2016-08-13 DIAGNOSIS — J45909 Unspecified asthma, uncomplicated: Secondary | ICD-10-CM | POA: Insufficient documentation

## 2016-08-13 NOTE — ED Triage Notes (Signed)
Pt c/o chronic back pain, [facility] and states the steroid shot did not help, and made his restless leg syndrome worse

## 2016-08-14 ENCOUNTER — Emergency Department (HOSPITAL_BASED_OUTPATIENT_CLINIC_OR_DEPARTMENT_OTHER)
Admission: EM | Admit: 2016-08-14 | Discharge: 2016-08-14 | Disposition: A | Discharge status: Home / Self Care | Payer: Self-pay | Attending: Dermatology | Admitting: Dermatology

## 2016-08-14 HISTORY — DX: Restless legs syndrome: G25.81

## 2016-08-14 HISTORY — DX: Dorsalgia, unspecified: M54.9

## 2016-08-14 HISTORY — DX: Other chronic pain: G89.29

## 2016-08-14 NOTE — ED Notes (Signed)
Pt called to treatment room with no answer from lobby.  

## 2016-08-16 ENCOUNTER — Ambulatory Visit (INDEPENDENT_AMBULATORY_CARE_PROVIDER_SITE_OTHER): Payer: Self-pay | Admitting: Family Medicine

## 2016-08-16 ENCOUNTER — Encounter: Payer: Self-pay | Admitting: Family Medicine

## 2016-08-16 VITALS — BP 148/95 | HR 79

## 2016-08-16 DIAGNOSIS — M545 Low back pain, unspecified: Secondary | ICD-10-CM

## 2016-08-16 NOTE — Progress Notes (Signed)
   Oscar FosterChristopher M Anderson is a 30 y.o. male who [facility] for review of lumbar MRI results for back pain.  Since getting the MRI last week, which showed bulging discs at L4-5 and L5-S1, he was referred for a lumbar epidural steroid injection. He presented to the ED 3 days ago for back pain and was given IM dexamethasone. He returned to the ED the next day saying that the shot did not help, and he was given a dosepack of methylprednisolone that he did not take. He would prefer not to have an epidural injection.   Past Medical History:  Diagnosis Date  . Asthma   . Chronic back pain   . Restless leg syndrome    No past surgical history on file. Social History  Substance Use Topics  . Smoking status: Current Every Day Smoker    Packs/day: 0.50    Types: Cigarettes  . Smokeless tobacco: Not on file  . Alcohol use No     Comment: occ     ROS:  As above   Medications: Current Outpatient Prescriptions  Medication Sig Dispense Refill  . meloxicam (MOBIC) 15 MG tablet Take 1 tablet (15 mg total) by mouth daily. 15 tablet 0   No current facility-administered medications for this visit.    Allergies  Allergen Reactions  . Flexeril [Cyclobenzaprine]     Causes restless leg syndrome  . Lamictal [Lamotrigine]     Worsened manic behavior  . Tramadol Nausea Only and Rash     Exam:  BP (!) 148/95   Pulse 79  General: Well Developed, well nourished, and in no acute distress.  Neuro/Psych: Alert and oriented x3, extra-ocular muscles intact, able to move all 4 extremities, sensation grossly intact. Skin: Warm and dry, no rashes noted.  Respiratory: Not using accessory muscles, speaking in full sentences, trachea midline.  Cardiovascular: Pulses palpable, no extremity edema. Abdomen: Does not appear distended. Patient is able to walk unassisted.   No results found for this or any previous visit (from the past 48  hour(s)). No results found.  MR Lumbar Spine w/o Contrast 08/09/16:  IMPRESSION: L4-5 moderate central disc protrusion and annular fissure results in mild canal stenosis.  L5-S1 large broad-based disc bulge and annular fissure contacts the traversing RIGHT S1 nerve.  Moderate RIGHT and mild LEFT L5-S1 neural foraminal narrowing.   Assessment and Plan: 30 y.o. male with acute-on-chronic back pain. We discussed disc protrusion at L4-5 and L5-S on recent MRI as the likely source of his chronic back pain, and that steroids will help shrink the disc. Acute pain likely due to muscle strain. Patient is at high risk for opioid addiction and has not tolerated other pain medications in the past.  - Patient agrees to try methylprednisolone dosepack - PT referral. Patient is currently uninsured but will speak with PT.   Orders Placed This Encounter  Procedures  . Ambulatory referral to Physical Therapy    Referral Priority:   Routine    Referral Type:   Physical Medicine    Referral Reason:   Specialty Services Required    Requested Specialty:   Physical Therapy    Number of Visits Requested:   1    Discussed warning signs or symptoms. Please see discharge instructions. Patient expresses understanding.  This patient was seen and interviewed and examined independently by myself. Oscar GrahamEvan Corey, MD

## 2016-08-16 NOTE — Patient Instructions (Addendum)
Thank you for coming in today. Return in 1 month or sooner if needed.  Take the dosepack.  You should hear from physical therapy soon.   Come back or go to the emergency room if you notice new weakness new numbness problems walking or bowel or bladder problems.

## 2016-08-16 NOTE — Telephone Encounter (Signed)
Message left with [facility] to schedule pt.  

## 2016-08-24 ENCOUNTER — Ambulatory Visit: Payer: No Typology Code available for payment source | Attending: Family Medicine | Admitting: Physical Therapy

## 2016-08-24 DIAGNOSIS — G8929 Other chronic pain: Secondary | ICD-10-CM | POA: Diagnosis present

## 2016-08-24 DIAGNOSIS — M545 Low back pain, unspecified: Secondary | ICD-10-CM

## 2016-08-24 DIAGNOSIS — R293 Abnormal posture: Secondary | ICD-10-CM | POA: Insufficient documentation

## 2016-08-24 NOTE — Patient Instructions (Signed)
Hamstring Step 2    Left foot relaxed, knee straight, other leg bent, foot flat. Raise straight leg further upward to maximal range. Hold _30__ seconds. Relax leg completely down. Repeat __3_ times.    Bridging    Slowly raise buttocks from floor, keeping stomach tight. Repeat __15__ times per set. Do __2__ sets per session. Hold 5 seconds at the top.
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
## 2016-08-26 ENCOUNTER — Ambulatory Visit: Payer: No Typology Code available for payment source | Admitting: Physical Therapy

## 2016-08-26 ENCOUNTER — Ambulatory Visit
Admission: RE | Admit: 2016-08-26 | Discharge: 2016-08-26 | Disposition: A | Discharge status: Home / Self Care | Payer: Self-pay | Source: Ambulatory Visit | Attending: Family Medicine | Admitting: Family Medicine

## 2016-08-26 DIAGNOSIS — M545 Low back pain, unspecified: Secondary | ICD-10-CM

## 2016-08-26 DIAGNOSIS — G8929 Other chronic pain: Secondary | ICD-10-CM

## 2016-08-26 DIAGNOSIS — R293 Abnormal posture: Secondary | ICD-10-CM

## 2016-08-26 NOTE — Therapy (Signed)
[facility] [place] [address]  [address] [place], [place], [address] Phone: [phone]   Fax:  [phone]  Physical Therapy Treatment  Patient Details  Name: Oscar Anderson MRN: [id number] Date of Birth: 10/11/1985 Referring Provider: Dr. Rodolph BongEvan S Corey  Encounter Date: 08/26/2016      PT End of Session - 08/26/16 1016    Visit Number 2   Number of Visits 12   Date for PT Re-Evaluation 10/05/16   PT Start Time 0935   PT Stop Time 1025  estim/traction only   PT Time Calculation (min) 50 min   Activity Tolerance Patient tolerated treatment well   Behavior During Therapy [facility] for tasks assessed/performed      Past Medical History:  Diagnosis Date  . Asthma   . Chronic back pain   . Restless leg syndrome     No past surgical history on file.  There were no vitals filed for this visit.      Subjective Assessment - 08/26/16 0935    Subjective having a lot of pain today; didn't sleep last night due to pain.     Limitations Sitting;Standing;Walking   Diagnostic tests MRI - "slipped and bulging discs" - per patient report   Currently in Pain? Yes   Pain Score 8    Pain Location Back   Pain Orientation Lower;Right   Pain Descriptors / Indicators Dull;Stabbing   Pain Type Chronic pain   Pain Radiating Towards down RLE to toes   Pain Onset More than a month ago   Pain Frequency Constant   Aggravating Factors  bending, sitting, lying down   Pain Relieving Factors heat                         [facility] Adult PT Treatment/Exercise - 08/26/16 0936      Modalities   Modalities Traction;Electrical Stimulation;Moist Heat     Moist Heat Therapy   Number Minutes Moist Heat 15 Minutes   Moist Heat Location Lumbar Spine     Electrical Stimulation   Electrical Stimulation Location low back in prone   Electrical Stimulation Action IFC   Electrical Stimulation Parameters to tolerance   Electrical  Stimulation Goals Pain     Traction   Type of Traction Lumbar  prone   Min (lbs) 60   Max (lbs) 70   Hold Time 60   Rest Time 20   Time 15                     PT Long Term Goals - 08/26/16 1016      PT LONG TERM GOAL #1   Title patient to be independent with advanced HEP (10/05/16)   Status On-going     PT LONG TERM GOAL #2   Title Patient to improve B hamstring flexibility to approx 80 deg with no increase in pain (10/05/16)   Status On-going     PT LONG TERM GOAL #3   Title Patient to improve B LE strength to >/= 4+/5 with no increase in pain (10/05/16)   Status On-going     PT LONG TERM GOAL #4   Title Patient to improve lumbar AROM to WNL with pain no greather than 2/10 (10/05/16)   Status On-going     PT LONG TERM GOAL #5   Title Patient to report ability or demonstrate ability to ambulate for >/= 30 min with pain no greater  than 2/10 (10/05/16)   Status On-going               Plan - 08/26/16 1016    Clinical Impression Statement Pt arrives today with c/o increased pain and inability to sleep last night.  Pain rated 8/10 initially at session so session today focused on pain control.  Initiated estim with pain down to 6/10 after estim followed up with traction in prone.  Pain 5-6/10 after traction but reports improved mobility.   PT Treatment/Interventions ADLs/Self Care Home Management;Cryotherapy;Electrical Stimulation;Iontophoresis 4mg /ml Dexamethasone;Moist Heat;Traction;Ultrasound;Therapeutic exercise;Therapeutic activities;Patient/family education;Manual techniques;Passive range of motion;Vasopneumatic Device;Taping;Dry needling   PT Next Visit Plan progress hip/core strength, hip flexibiliy   Consulted and Agree with Plan of Care Patient      Patient will benefit from skilled therapeutic intervention in order to improve the following deficits and impairments:  Decreased activity tolerance, Decreased strength, Pain, Increased edema, Increased  muscle spasms, Improper body mechanics, Decreased endurance, Decreased range of motion  Visit Diagnosis: Chronic midline low back pain without sciatica  Abnormal posture     Problem List Patient Active Problem List   Diagnosis Date Noted  . Lumbar radiculopathy 08/10/2016  . Lumbago 08/09/2016  . Bipolar I disorder (HCC) 02/18/2016  . Pilonidal cyst 01/21/2016        Clarita CraneStephanie F Braelynn Lupton, PT, DPT 08/26/16 10:29 AM    [facility] [place] [address]  [address] [place], [place], [address] Phone: [phone]   Fax:  [phone]  Name: Oscar Anderson MRN: [id number] Date of Birth: 06/15/1986

## 2016-08-26 NOTE — Discharge Instructions (Signed)

## 2016-08-31 ENCOUNTER — Ambulatory Visit: Payer: No Typology Code available for payment source

## 2016-09-02 ENCOUNTER — Ambulatory Visit: Payer: No Typology Code available for payment source

## 2016-09-02 DIAGNOSIS — G8929 Other chronic pain: Secondary | ICD-10-CM

## 2016-09-02 DIAGNOSIS — R293 Abnormal posture: Secondary | ICD-10-CM

## 2016-09-02 DIAGNOSIS — M545 Low back pain: Secondary | ICD-10-CM | POA: Diagnosis not present

## 2016-09-02 NOTE — Therapy (Signed)
[facility] [place] [address]  [address] [place], [place], [address] Phone: [phone]   Fax:  [phone]  Physical Therapy Treatment  Patient Details  Name: Oscar Anderson MRN: [id number] Date of Birth: 06/19/1986 Referring Provider: Dr. Rodolph BongEvan S Corey  Encounter Date: 09/02/2016      PT End of Session - 09/02/16 1025    Visit Number 3   Number of Visits 12   Date for PT Re-Evaluation 10/05/16   PT Start Time 1019   PT Stop Time 1102   PT Time Calculation (min) 43 min   Activity Tolerance Patient tolerated treatment well   Behavior During Therapy [facility] for tasks assessed/performed      Past Medical History:  Diagnosis Date  . Asthma   . Chronic back pain   . Restless leg syndrome     No past surgical history on file.  There were no vitals filed for this visit.      Subjective Assessment - 09/02/16 1022    Subjective Pt. reporting he wakes up with LBP ~ 3 times/night.  Pt. reporting pain free initilaly today.     Diagnostic tests MRI - "slipped and bulging discs" - per patient report   Currently in Pain? Yes   Pain Score 3    Pain Location Back   Pain Orientation Lower;Right   Pain Descriptors / Indicators Dull;Stabbing   Pain Type Chronic pain   Pain Onset More than a month ago   Pain Frequency Constant   Aggravating Factors  bending, sittin, lying down   Multiple Pain Sites No           [facility] Adult PT Treatment/Exercise - 09/02/16 1039      Lumbar Exercises: Stretches   Passive Hamstring Stretch 2 reps;30 seconds  Bilateral LE   Piriformis Stretch 2 reps;30 seconds  Bilateral      Lumbar Exercises: Aerobic   Stationary Bike Recumbent bike: level 4, 6 min      Lumbar Exercises: Supine   Other Supine Lumbar Exercises Hooklying bridge 2 x 15 reps 3" hold    Other Supine Lumbar Exercises R sidelying L hip abduction with 2# 3 x 15 reps     Traction   Type of Traction Lumbar   Min (lbs) 65    Max (lbs) 75   Hold Time 60   Rest Time 20   Time 15            PT Long Term Goals - 08/26/16 1016      PT LONG TERM GOAL #1   Title patient to be independent with advanced HEP (10/05/16)   Status On-going     PT LONG TERM GOAL #2   Title Patient to improve B hamstring flexibility to approx 80 deg with no increase in pain (10/05/16)   Status On-going     PT LONG TERM GOAL #3   Title Patient to improve B LE strength to >/= 4+/5 with no increase in pain (10/05/16)   Status On-going     PT LONG TERM GOAL #4   Title Patient to improve lumbar AROM to WNL with pain no greather than 2/10 (10/05/16)   Status On-going     PT LONG TERM GOAL #5   Title Patient to report ability or demonstrate ability to ambulate for >/= 30 min with pain no greater than 2/10 (10/05/16)   Status On-going  Plan - 09/02/16 1025    Clinical Impression Statement Pt. still reporting he feels benefit from mechanical lumbar traction today thus traction progressed to 75#/65# without complaint.  Today's hip lumbopelvic strengthening therex focusing on hip abduction and extension strengthening activity with pt. admitting to poor tolerance with HEP.  Will plan to progress lumbopelvic strengthening, flexibility, and traction if benefit noted.     PT Treatment/Interventions ADLs/Self Care Home Management;Cryotherapy;Electrical Stimulation;Iontophoresis 4mg /ml Dexamethasone;Moist Heat;Traction;Ultrasound;Therapeutic exercise;Therapeutic activities;Patient/family education;Manual techniques;Passive range of motion;Vasopneumatic Device;Taping;Dry needling   PT Next Visit Plan progress hip/core strength, hip flexibiliy      Patient will benefit from skilled therapeutic intervention in order to improve the following deficits and impairments:  Decreased activity tolerance, Decreased strength, Pain, Increased edema, Increased muscle spasms, Improper body mechanics, Decreased endurance, Decreased range of  motion  Visit Diagnosis: Chronic midline low back pain without sciatica  Abnormal posture     Problem List Patient Active Problem List   Diagnosis Date Noted  . Lumbar radiculopathy 08/10/2016  . Lumbago 08/09/2016  . Bipolar I disorder (HCC) 02/18/2016  . Pilonidal cyst 01/21/2016    Kermit BaloMicah Emree Locicero, PTA 09/02/16 1:32 PM  [facility] [place] [address]  [address] [place], [place], [address] Phone: [phone]   Fax:  [phone]  Name: Oscar FosterChristopher M Anderson MRN: [id number] Date of Birth: 12/01/1985

## 2016-09-07 ENCOUNTER — Ambulatory Visit: Payer: No Typology Code available for payment source

## 2016-09-07 DIAGNOSIS — M545 Low back pain, unspecified: Secondary | ICD-10-CM

## 2016-09-07 DIAGNOSIS — R293 Abnormal posture: Secondary | ICD-10-CM

## 2016-09-07 DIAGNOSIS — G8929 Other chronic pain: Secondary | ICD-10-CM

## 2016-09-07 NOTE — Therapy (Addendum)
Dictation #1 [id number]  [id number] [place], [place], [address] Phone: [phone]   Fax:  [phone]  Physical Therapy Treatment  Patient Details  Name: DESTINY TRICKEY MRN: [id number] Date of Birth: July 20, 1986 Referring Provider: Dr. Gregor Hams  Encounter Date: 09/07/2016      PT End of Session - 09/07/16 0946    Visit Number 4   Number of Visits 12   Date for PT Re-Evaluation 10/05/16   PT Start Time 0927   PT Stop Time 1025   PT Time Calculation (min) 58 min   Activity Tolerance Patient tolerated treatment well   Behavior During Therapy [facility] for tasks assessed/performed      Past Medical History:  Diagnosis Date  . Asthma   . Chronic back pain   . Restless leg syndrome     No past surgical history on file.  There were no vitals filed for this visit.      Subjective Assessment - 09/07/16 0945    Subjective Pt. reporting he felt good following last treatment and is feeling mild benefit from traction at this point.     Currently in Pain? Yes   Pain Score 6    Pain Location Back   Pain Orientation Lower;Right   Pain Descriptors / Indicators Dull;Stabbing  occasional heat   Pain Type Chronic pain   Multiple Pain Sites No             [facility] Adult PT Treatment/Exercise - 09/07/16 0942      Lumbar Exercises: Stretches   Passive Hamstring Stretch 2 reps;30 seconds  B   Single Knee to Chest Stretch 2 reps;30 seconds  B   Lower Trunk Rotation 2 reps;30 seconds  5" x 5 each way   Piriformis Stretch 2 reps;30 seconds  B     Lumbar Exercises: Aerobic   Stationary Bike Recumbent bike: level 4, 6 min      Lumbar Exercises: Standing   Scapular Retraction 15 reps;AROM;Both  2 sets    Theraband Level (Scapular Retraction) Level 4 (Blue)   Scapular Retraction Limitations 5" hold    Row 15 reps;AROM;Both;Theraband  2 sets    Theraband Level (Row)  Other (comment)   Other Standing Lumbar Exercises Standing horizontal abduction with blue TB x 15 reps x 2 sets       Lumbar Exercises: Supine   Other Supine Lumbar Exercises Hooklying bridge with sustained adduction ball squeeze x 15 reps 3" hold    Other Supine Lumbar Exercises Hooklying bridge with abduction/ER blue TB x 10 reps each side     Knee/Hip Exercises: Sidelying   Clams B sidelying with blue TB x 15 reps   1 set      Traction   Type of Traction Lumbar   Min (lbs) 70   Max (lbs) 80   Hold Time 60   Rest Time 20   Time 15             PT Long Term Goals - 08/26/16 1016      PT LONG TERM GOAL #1   Title patient to be independent with advanced HEP (10/05/16)   Status On-going     PT LONG TERM GOAL #2   Title Patient to improve B hamstring flexibility to approx 80 deg with no increase in pain (10/05/16)   Status On-going     PT LONG TERM GOAL #3   Title Patient  to improve B LE strength to >/= 4+/5 with no increase in pain (10/05/16)   Status On-going     PT LONG TERM GOAL #4   Title Patient to improve lumbar AROM to WNL with pain no greather than 2/10 (10/05/16)   Status On-going     PT LONG TERM GOAL #5   Title Patient to report ability or demonstrate ability to ambulate for >/= 30 min with pain no greater than 2/10 (10/05/16)   Status On-going               Plan - 09/07/16 0951    Clinical Impression Statement Pt. tolerating mild progress in lumbar traction today to 80#/70# and increased intensity and repetitions with lumbopelvic stability activities without complaint.  Pt. reporting an initial LBP today of 6/10 today, which remained unchanged throughout therex.  Mid/upper back band strengthening activities introduced today with pt. reporting appropriateness of intensity level.  Will plan to continues progressing mid back and lumbopelvic strengthening activities per pt. tolerance in coming visits.     PT Treatment/Interventions ADLs/Self Care Home  Management;Cryotherapy;Electrical Stimulation;Iontophoresis 21m/ml Dexamethasone;Moist Heat;Traction;Ultrasound;Therapeutic exercise;Therapeutic activities;Patient/family education;Manual techniques;Passive range of motion;Vasopneumatic Device;Taping;Dry needling   PT Next Visit Plan progress hip/core strength, hip flexibiliy      Patient will benefit from skilled therapeutic intervention in order to improve the following deficits and impairments:  Decreased activity tolerance, Decreased strength, Pain, Increased edema, Increased muscle spasms, Improper body mechanics, Decreased endurance, Decreased range of motion  Visit Diagnosis: Chronic midline low back pain without sciatica  Abnormal posture     Problem List Patient Active Problem List   Diagnosis Date Noted  . Lumbar radiculopathy 08/10/2016  . Lumbago 08/09/2016  . Bipolar I disorder ([place] 02/18/2016  . Pilonidal cyst 01/21/2016    MBess Harvest PTA 09/07/16 12:38 PM   PHYSICAL THERAPY DISCHARGE SUMMARY  Visits from Start of Care: 4  Current functional level related to goals / functional outcomes: See above; continued low back pain   Remaining deficits: See above; reduced core/pelvic strength   Education / Equipment: HEP  Plan: Patient agrees to discharge.  Patient goals were not met. Patient is being discharged due to not returning since the last visit.  ?????    SLanney Gins PT, DPT 10/26/16 8:28 AM  [facility] [place] [address]Phone: [phone]  Fax:  [phone] Name: CZEBULON GANTTMRN: [id number] of Birth: [birth year]

## 2016-09-09 ENCOUNTER — Ambulatory Visit: Payer: No Typology Code available for payment source

## 2016-09-14 ENCOUNTER — Ambulatory Visit: Payer: No Typology Code available for payment source | Admitting: Physical Therapy

## 2016-09-16 ENCOUNTER — Ambulatory Visit: Payer: No Typology Code available for payment source | Admitting: Physical Therapy

## 2016-09-21 ENCOUNTER — Ambulatory Visit: Payer: No Typology Code available for payment source | Attending: Family Medicine

## 2016-09-23 ENCOUNTER — Ambulatory Visit: Payer: No Typology Code available for payment source

## 2018-05-17 ENCOUNTER — Encounter: Payer: Self-pay | Admitting: Emergency Medicine

## 2018-05-17 ENCOUNTER — Other Ambulatory Visit: Payer: Self-pay

## 2018-05-17 ENCOUNTER — Emergency Department: Payer: Self-pay

## 2018-05-17 ENCOUNTER — Emergency Department
Admission: EM | Admit: 2018-05-17 | Discharge: 2018-05-17 | Disposition: A | Discharge status: Home / Self Care | Payer: Self-pay | Attending: Emergency Medicine | Admitting: Emergency Medicine

## 2018-05-17 DIAGNOSIS — Y929 Unspecified place or not applicable: Secondary | ICD-10-CM | POA: Insufficient documentation

## 2018-05-17 DIAGNOSIS — Y9383 Activity, rough housing and horseplay: Secondary | ICD-10-CM | POA: Insufficient documentation

## 2018-05-17 DIAGNOSIS — Y999 Unspecified external cause status: Secondary | ICD-10-CM | POA: Insufficient documentation

## 2018-05-17 DIAGNOSIS — J45909 Unspecified asthma, uncomplicated: Secondary | ICD-10-CM | POA: Insufficient documentation

## 2018-05-17 DIAGNOSIS — W19XXXA Unspecified fall, initial encounter: Secondary | ICD-10-CM | POA: Insufficient documentation

## 2018-05-17 DIAGNOSIS — F17210 Nicotine dependence, cigarettes, uncomplicated: Secondary | ICD-10-CM | POA: Insufficient documentation

## 2018-05-17 DIAGNOSIS — S42401A Unspecified fracture of lower end of right humerus, initial encounter for closed fracture: Secondary | ICD-10-CM | POA: Insufficient documentation

## 2018-05-17 MED ORDER — HYDROCODONE-ACETAMINOPHEN 5-325 MG PO TABS: 1.0000 | ORAL_TABLET | Freq: Four times a day (QID) | ORAL | 0 refills | Status: DC | PRN

## 2018-05-17 MED ORDER — HYDROCODONE-ACETAMINOPHEN 5-325 MG PO TABS
1.0000 | ORAL_TABLET | Freq: Once | ORAL | Status: AC
  Administered 2018-05-17: 1 via ORAL
  Filled 2018-05-17: qty 1

## 2018-05-17 NOTE — ED Triage Notes (Signed)
Patient to ER for c/o elbow injury to right elbow. Patient states he fell on it a couple of days ago while "rough housing". Patient states he is unable to bend his arm all the way out. Patient has significant swelling, but is able to flex elbow.

## 2018-05-17 NOTE — ED Notes (Signed)
Patient educated about not driving or performing other critical tasks (such as operating heavy machinery, caring for infant/toddler/child) due to sedative nature of narcotic medications received while in the ED.  Pt/caregiver verbalized understanding. Pt reports wife is driving

## 2018-05-17 NOTE — Discharge Instructions (Signed)
Follow-up with orthopedics.  Please call Dr. Allena KatzPatel for an appointment.  Tell them you were seen in the emergency department and were instructed to follow-up in his office.  Take Tylenol and ibuprofen for pain.  For pain that is not controlled by Tylenol or ibuprofen please take the Vicodin as needed.  Apply ice and keep the area elevated.  As the joint swelling decreases she will have less pain.  Do not take the narcotic unless absolutely necessary due to the addictive quality of the medication.

## 2018-05-17 NOTE — ED Provider Notes (Signed)
[facility] Emergency Department Provider Note  ____________________________________________   First MD Initiated Contact with Patient 05/17/18 2129     (approximate)  I have reviewed the triage vital signs and the nursing notes.   HISTORY  Chief Complaint Elbow Injury    HPI Oscar SHANNAHAN is a 32 y.o. male presents emergency department complaining of right elbow pain.  He states that he was horsing around and landed on the elbow approximately 2 days ago.  He states that the elbow has continued to swell and he cannot extend it.  He states he has been taking Tylenol and ibuprofen without any relief.    Past Medical History:  Diagnosis Date  . Asthma   . Chronic back pain   . Restless leg syndrome     Patient Active Problem List   Diagnosis Date Noted  . Lumbar radiculopathy 08/10/2016  . Lumbago 08/09/2016  . Bipolar I disorder (HCC) 02/18/2016  . Pilonidal cyst 01/21/2016    History reviewed. No pertinent surgical history.  Prior to Admission medications   Medication Sig Start Date End Date Taking? Authorizing Provider  HYDROcodone-acetaminophen (NORCO/VICODIN) 5-325 MG tablet Take 1 tablet by mouth every 6 (six) hours as needed for moderate pain. 05/17/18   Valaree Fresquez, Roselyn Bering, PA-C  meloxicam (MOBIC) 15 MG tablet Take 1 tablet (15 mg total) by mouth daily. Patient not taking: Reported on 08/24/2016 08/09/16   Rodolph Bong, MD    Allergies Flexeril [cyclobenzaprine]; Lamictal [lamotrigine]; Toradol [ketorolac tromethamine]; and Tramadol  No family history on file.  Social History Social History   Tobacco Use  . Smoking status: Current Every Day Smoker    Packs/day: 0.50    Types: Cigarettes  . Smokeless tobacco: Never Used  Substance Use Topics  . Alcohol use: No    Comment: occ  . Drug use: Yes    Types: Marijuana    Comment: former    Review of Systems  Constitutional: No fever/chills Eyes: No visual changes. ENT:  No sore throat. Respiratory: Denies cough Genitourinary: Negative for dysuria. Musculoskeletal: Negative for back pain.  Positive for right elbow pain Skin: Negative for rash.    ____________________________________________   PHYSICAL EXAM:  VITAL SIGNS: ED Triage Vitals  Enc Vitals Group     BP 05/17/18 2053 139/89     Pulse Rate 05/17/18 2053 95     Resp 05/17/18 2053 20     Temp 05/17/18 2053 98.1 F (36.7 C)     Temp Source 05/17/18 2053 Oral     SpO2 05/17/18 2053 99 %     Weight 05/17/18 2055 155 lb (70.3 kg)     Height 05/17/18 2055 5\' 8"  (1.727 m)     Head Circumference --      Peak Flow --      Pain Score 05/17/18 2054 8     Pain Loc --      Pain Edu? --      Excl. in GC? --     Constitutional: Alert and oriented. Well appearing and in no acute distress. Eyes: Conjunctivae are normal.  Head: Atraumatic. Nose: No congestion/rhinnorhea. Mouth/Throat: Mucous membranes are moist.   Cardiovascular: Normal rate, regular rhythm.  Respiratory: Normal respiratory effort.  No retractions,   GU: deferred Musculoskeletal: Decreased range of motion of the right elbow.  Elbow is tender along the epicondyles.  He is able to move the wrist but movement of the wrist does reproduce pain in the elbow.  He  is neurovascularly intact. Neurologic:  Normal speech and language.  Skin:  Skin is warm, dry and intact. No rash noted. Psychiatric: Mood and affect are normal. Speech and behavior are normal.  ____________________________________________   LABS (all labs ordered are listed, but only abnormal results are displayed)  Labs Reviewed - No data to display ____________________________________________   ____________________________________________  RADIOLOGY  X-ray of the right elbow shows a fracture of the proximal ulna  ____________________________________________   PROCEDURES  Procedure(s) performed:   .Splint Application Date/Time: 05/17/2018 10:22  PM Performed by: Faythe GheeFisher, Monti Jilek W, PA-C Authorized by: Faythe GheeFisher, Zianna Dercole W, PA-C   Consent:    Consent obtained:  Verbal   Consent given by:  Patient   Risks discussed:  Discoloration, numbness, pain and swelling   Alternatives discussed:  Alternative treatment Pre-procedure details:    Sensation:  Normal Procedure details:    Laterality:  Right   Location:  Elbow   Elbow:  R elbow   Splint type:  Long arm   Supplies:  Ortho-Glass and sling Post-procedure details:    Pain:  Improved   Sensation:  Normal   Patient tolerance of procedure:  Tolerated well, no immediate complications Comments:     The splint was applied by the tech      ____________________________________________   INITIAL IMPRESSION / ASSESSMENT AND PLAN / ED COURSE  Pertinent labs & imaging results that were available during my care of the patient were reviewed by me and considered in my medical decision making (see chart for details).   Patient is 32 year old male presents emergency department complaining of right elbow pain.  He landed on the elbow 2 days ago while horsing around.  States the elbows continue to swell and now he cannot extend it without difficulty.  He denies any other injuries.  Physical exam patient is tender at the right elbow.  There is swelling noted.  Decreased range of motion due to pain.  Pain is reproduced with movement of the wrist.  He is neurovascularly intact.  Remainder the exam is unremarkable  X-ray of the right elbow shows a proximal ulna fracture.  Explained the findings to the patient.  He was placed in a long-arm OCL by the tech.  He was given a sling.  He was given a Vicodin 5/325 while here in the ED.  A prescription for Vicodin 5/325 #15 was sent to [facility] [address]  He is to follow-up with orthopedics.  Keep the arm elevated and ice.  He is not to drive machinery while on the medication.  He states he understands will comply with instructions.  Was discharged in stable  condition     As part of my medical decision making, I reviewed the following data within the electronic MEDICAL RECORD NUMBER Nursing notes reviewed and incorporated, Old chart reviewed, Radiograph reviewed x-ray of the right elbow shows a proximal ulna fracture., Notes from prior ED visits and [place] Controlled Substance Database  ____________________________________________   FINAL CLINICAL IMPRESSION(S) / ED DIAGNOSES  Final diagnoses:  Elbow fracture, right, closed, initial encounter      NEW MEDICATIONS STARTED DURING THIS VISIT:  Discharge Medication List as of 05/17/2018 10:00 PM    START taking these medications   Details  HYDROcodone-acetaminophen (NORCO/VICODIN) 5-325 MG tablet Take 1 tablet by mouth every 6 (six) hours as needed for moderate pain., Starting Wed 05/17/2018, Normal         Note:  This document was prepared using Dragon voice recognition  software and may include unintentional dictation errors.    Faythe Ghee, PA-C 05/17/18 2224    Arnaldo Natal, MD 05/17/18 2242

## 2018-08-14 ENCOUNTER — Emergency Department (HOSPITAL_COMMUNITY): Payer: No Typology Code available for payment source

## 2018-08-14 ENCOUNTER — Emergency Department (HOSPITAL_COMMUNITY)
Admission: EM | Admit: 2018-08-14 | Discharge: 2018-08-15 | Disposition: A | Discharge status: Home / Self Care | Payer: No Typology Code available for payment source | Attending: Emergency Medicine | Admitting: Emergency Medicine

## 2018-08-14 ENCOUNTER — Encounter (HOSPITAL_COMMUNITY): Payer: Self-pay | Admitting: Emergency Medicine

## 2018-08-14 DIAGNOSIS — F17210 Nicotine dependence, cigarettes, uncomplicated: Secondary | ICD-10-CM | POA: Insufficient documentation

## 2018-08-14 DIAGNOSIS — Y92410 Unspecified street and highway as the place of occurrence of the external cause: Secondary | ICD-10-CM | POA: Diagnosis not present

## 2018-08-14 DIAGNOSIS — R079 Chest pain, unspecified: Secondary | ICD-10-CM

## 2018-08-14 DIAGNOSIS — Y999 Unspecified external cause status: Secondary | ICD-10-CM | POA: Diagnosis not present

## 2018-08-14 DIAGNOSIS — R0789 Other chest pain: Secondary | ICD-10-CM | POA: Insufficient documentation

## 2018-08-14 DIAGNOSIS — J45909 Unspecified asthma, uncomplicated: Secondary | ICD-10-CM | POA: Insufficient documentation

## 2018-08-14 DIAGNOSIS — Y9389 Activity, other specified: Secondary | ICD-10-CM | POA: Diagnosis not present

## 2018-08-14 DIAGNOSIS — R109 Unspecified abdominal pain: Secondary | ICD-10-CM | POA: Insufficient documentation
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
## 2018-08-14 NOTE — ED Notes (Signed)
Pt to xray via stretcher

## 2018-08-15 ENCOUNTER — Emergency Department (HOSPITAL_COMMUNITY): Payer: No Typology Code available for payment source

## 2018-08-15 LAB — COMPREHENSIVE METABOLIC PANEL
ALT: 17 U/L (ref 0–44)
AST: 26 U/L (ref 15–41)
Albumin: 4.2 g/dL (ref 3.5–5.0)
Alkaline Phosphatase: 79 U/L (ref 38–126)
Anion gap: 9 (ref 5–15)
BILIRUBIN TOTAL: 0.5 mg/dL (ref 0.3–1.2)
BUN: 12 mg/dL (ref 6–20)
CO2: 25 mmol/L (ref 22–32)
Calcium: 9.5 mg/dL (ref 8.9–10.3)
Chloride: 104 mmol/L (ref 98–111)
Creatinine, Ser: 0.85 mg/dL (ref 0.61–1.24)
GFR calc Af Amer: >60 mL/min (ref >60)
Glucose, Bld: 107 mg/dL — ABNORMAL HIGH (ref 70–99)
POTASSIUM: 3.6 mmol/L (ref 3.5–5.1)
Sodium: 138 mmol/L (ref 135–145)
Total Protein: 6.7 g/dL (ref 6.5–8.1)

## 2018-08-15 LAB — I-STAT CHEM 8, ED
BUN: 13 mg/dL (ref 6–20)
CALCIUM ION: 1.22 mmol/L (ref 1.15–1.40)
CREATININE: 0.8 mg/dL (ref 0.61–1.24)
Chloride: 104 mmol/L (ref 98–111)
GLUCOSE: 101 mg/dL — AB (ref 70–99)
HEMATOCRIT: 41 % (ref 39.0–52.0)
Hemoglobin: 13.9 g/dL (ref 13.0–17.0)
Potassium: 3.6 mmol/L (ref 3.5–5.1)
Sodium: 139 mmol/L (ref 135–145)
TCO2: 26 mmol/L (ref 22–32)

## 2018-08-15 LAB — ETHANOL: Alcohol, Ethyl (B): <10 mg/dL (ref <10)

## 2018-08-15 LAB — PROTIME-INR
INR: 1.03
PROTHROMBIN TIME: 13.4 s (ref 11.4–15.2)

## 2018-08-15 LAB — CBC
HCT: 42.3 % (ref 39.0–52.0)
HEMOGLOBIN: 14.3 g/dL (ref 13.0–17.0)
MCH: 28.9 pg (ref 26.0–34.0)
MCHC: 33.8 g/dL (ref 30.0–36.0)
MCV: 85.6 fL (ref 80.0–100.0)
PLATELETS: 275 10*3/uL (ref 150–400)
RBC: 4.94 MIL/uL (ref 4.22–5.81)
RDW: 11.4 % — AB (ref 11.5–15.5)
WBC: 11.7 10*3/uL — AB (ref 4.0–10.5)
nRBC: 0 % (ref 0.0–0.2)

## 2018-08-15 LAB — SAMPLE TO BLOOD BANK

## 2018-08-15 LAB — CDS SEROLOGY

## 2018-08-15 LAB — I-STAT CG4 LACTIC ACID, ED: LACTIC ACID, VENOUS: 0.49 mmol/L — AB (ref 0.5–1.9)

## 2018-08-15 MED ORDER — HYDROCODONE-ACETAMINOPHEN 5-325 MG PO TABS
2.0000 | ORAL_TABLET | Freq: Once | ORAL | Status: AC
  Administered 2018-08-15: 2 via ORAL
  Filled 2018-08-15: qty 2

## 2018-08-15 MED ORDER — FENTANYL CITRATE (PF) 100 MCG/2ML IJ SOLN: 50.0000 ug | Freq: Once | INTRAMUSCULAR | Status: DC

## 2018-08-15 MED ORDER — IOHEXOL 300 MG/ML  SOLN
100.0000 mL | Freq: Once | INTRAMUSCULAR | Status: AC | PRN
  Administered 2018-08-15: 100 mL via INTRAVENOUS

## 2018-08-15 NOTE — ED Notes (Signed)
Pt in CT at this time.

## 2018-08-15 NOTE — Discharge Instructions (Addendum)
1. Medications: Alternate tylenol and ibuprofen for pain control, usual home medications 2. Treatment: rest, drink plenty of fluids, gentle stretching 3. Follow Up: Please followup with your primary doctor in 3-5 days for discussion of your diagnoses and further evaluation after today's visit; if you do not have a primary care doctor use the resource guide provided to find one; Please return to the ER for worsening pain, vomiting blood, persistent vomiting, blood in urine or other concerns

## 2018-08-15 NOTE — ED Notes (Signed)
Patient verbalizes understanding of discharge instructions. Opportunity for questioning and answers were provided. Armband removed by staff, pt discharged from ED in wheelchair.  
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
## 2019-02-14 ENCOUNTER — Emergency Department (HOSPITAL_COMMUNITY): Payer: Self-pay

## 2019-02-14 ENCOUNTER — Encounter (HOSPITAL_COMMUNITY): Payer: Self-pay | Admitting: Emergency Medicine

## 2019-02-14 ENCOUNTER — Other Ambulatory Visit: Payer: Self-pay

## 2019-02-14 ENCOUNTER — Emergency Department (HOSPITAL_COMMUNITY)
Admission: EM | Admit: 2019-02-14 | Discharge: 2019-02-14 | Disposition: A | Discharge status: Home / Self Care | Payer: Self-pay | Attending: Emergency Medicine | Admitting: Emergency Medicine

## 2019-02-14 DIAGNOSIS — F17210 Nicotine dependence, cigarettes, uncomplicated: Secondary | ICD-10-CM | POA: Insufficient documentation

## 2019-02-14 DIAGNOSIS — Y998 Other external cause status: Secondary | ICD-10-CM | POA: Insufficient documentation

## 2019-02-14 DIAGNOSIS — Y929 Unspecified place or not applicable: Secondary | ICD-10-CM | POA: Insufficient documentation

## 2019-02-14 DIAGNOSIS — F1210 Cannabis abuse, uncomplicated: Secondary | ICD-10-CM | POA: Insufficient documentation

## 2019-02-14 DIAGNOSIS — J45909 Unspecified asthma, uncomplicated: Secondary | ICD-10-CM | POA: Insufficient documentation

## 2019-02-14 DIAGNOSIS — M79604 Pain in right leg: Secondary | ICD-10-CM | POA: Insufficient documentation

## 2019-02-14 DIAGNOSIS — S61431A Puncture wound without foreign body of right hand, initial encounter: Secondary | ICD-10-CM | POA: Insufficient documentation

## 2019-02-14 DIAGNOSIS — Z23 Encounter for immunization: Secondary | ICD-10-CM | POA: Insufficient documentation

## 2019-02-14 DIAGNOSIS — Y93K9 Activity, other involving animal care: Secondary | ICD-10-CM | POA: Insufficient documentation

## 2019-02-14 DIAGNOSIS — W540XXA Bitten by dog, initial encounter: Secondary | ICD-10-CM | POA: Insufficient documentation

## 2019-02-14 MED ORDER — NAPROXEN 500 MG PO TABS: 500.0000 mg | ORAL_TABLET | Freq: Two times a day (BID) | ORAL | 0 refills | Status: DC | PRN

## 2019-02-14 MED ORDER — NAPROXEN 250 MG PO TABS
500.0000 mg | ORAL_TABLET | Freq: Once | ORAL | Status: AC
  Administered 2019-02-14: 500 mg via ORAL
  Filled 2019-02-14: qty 2

## 2019-02-14 MED ORDER — TETANUS-DIPHTH-ACELL PERTUSSIS 5-2.5-18.5 LF-MCG/0.5 IM SUSP
0.5000 mL | Freq: Once | INTRAMUSCULAR | Status: AC
  Administered 2019-02-14: 0.5 mL via INTRAMUSCULAR
  Filled 2019-02-14: qty 0.5

## 2019-02-14 MED ORDER — AMOXICILLIN-POT CLAVULANATE 875-125 MG PO TABS
1.0000 | ORAL_TABLET | Freq: Once | ORAL | Status: AC
  Administered 2019-02-14: 1 via ORAL
  Filled 2019-02-14: qty 1

## 2019-02-14 MED ORDER — AMOXICILLIN-POT CLAVULANATE 875-125 MG PO TABS: 1.0000 | ORAL_TABLET | Freq: Two times a day (BID) | ORAL | 0 refills | Status: DC

## 2019-02-14 NOTE — ED Triage Notes (Signed)
Pt reports he was breaking up a dog fight when they ran into his R left leg and he fell over the dogs. Pt reports pain to R lower leg and knee. Pt has one puncture to R hand.

## 2019-02-14 NOTE — ED Notes (Signed)
Patient verbalizes understanding of discharge instructions. Opportunity for questioning and answers were provided. Armband removed by staff, pt discharged from ED ambulatory.   

## 2019-02-14 NOTE — ED Notes (Signed)
Pt states he fell over on the dogs that were fighting and hit a wooden floor. Can move toes but states he can not put any weight on it. Bilateral pedial pulses 2+. ETOH on board

## 2019-02-14 NOTE — ED Provider Notes (Signed)
[facility] Provider [facility]   CSN: [id number] Arrival date & time: 02/14/19  0130    History   Chief Complaint Chief Complaint  Patient presents with  . Leg Pain    HPI Oscar Anderson is a 33 y.o. male.     33 year old male with a history of asthma and chronic pain presents to the emergency department for complaints of right knee and lower extremity pain.  States that he was breaking up a dog fight when he fell over 1 of the dogs onto the ground.  He felt as though his right knee and leg locked.  He has been weightbearing since the incident without difficulty, though this aggravates his pain some.  No medications taken prior to arrival for pain.  Also reports a puncture wound to his right hand from the dog biting him.  His tetanus was last updated in 2012.  No associated numbness, paresthesias, weakness.     Past Medical History:  Diagnosis Date  . Asthma   . Chronic back pain   . Restless leg syndrome     Patient Active Problem List   Diagnosis Date Noted  . Lumbar radiculopathy 08/10/2016  . Lumbago 08/09/2016  . Bipolar I disorder (HCC) 02/18/2016  . Pilonidal cyst 01/21/2016    History reviewed. No pertinent surgical history.      Home Medications    Prior to Admission medications   Medication Sig Start Date End Date Taking? Authorizing Provider  albuterol (PROVENTIL HFA;VENTOLIN HFA) 108 (90 Base) MCG/ACT inhaler Inhale 1-2 puffs into the lungs every 6 (six) hours as needed for wheezing or shortness of breath.    [provider]  amoxicillin-clavulanate (AUGMENTIN) 875-125 MG tablet Take 1 tablet by mouth every 12 (twelve) hours. 02/14/19   Antony MaduraHumes, Jontavia Leatherbury, PA-C  HYDROcodone-acetaminophen (NORCO/VICODIN) 5-325 MG tablet Take 1 tablet by mouth every 6 (six) hours as needed for moderate pain. Patient not taking: Reported on 08/15/2018 05/17/18   Faythe GheeFisher, Susan W, PA-C  meloxicam (MOBIC) 15 MG tablet Take 1 tablet  (15 mg total) by mouth daily. Patient not taking: Reported on 08/24/2016 08/09/16   Rodolph Bongorey, Evan S, MD  naproxen (NAPROSYN) 500 MG tablet Take 1 tablet (500 mg total) by mouth every 12 (twelve) hours as needed for mild pain or moderate pain. 02/14/19   Antony MaduraHumes, Dyneshia Baccam, PA-C    Family History History reviewed. No pertinent family history.  Social History Social History   Tobacco Use  . Smoking status: Current Every Day Smoker    Packs/day: 0.50    Types: Cigarettes  . Smokeless tobacco: Never Used  Substance Use Topics  . Alcohol use: No    Frequency: Never  . Drug use: Yes    Types: Marijuana     Allergies   Flexeril [cyclobenzaprine]; Lamictal [lamotrigine]; Toradol [ketorolac tromethamine]; and Tramadol   Review of Systems Review of Systems Ten systems reviewed and are negative for acute change, except as noted in the HPI.    Physical Exam Updated Vital Signs BP 132/82   Pulse 99   Temp 98.6 F (37 C) (Oral)   Resp 16   SpO2 97%   Physical Exam Vitals signs and nursing note reviewed.  Constitutional:      General: He is not in acute distress.    Appearance: He is well-developed. He is not diaphoretic.     Comments: Nontoxic appearing and in NAD  HENT:     Head: Normocephalic and atraumatic.  Eyes:  General: No scleral icterus.    Conjunctiva/sclera: Conjunctivae normal.  Neck:     Musculoskeletal: Normal range of motion.  Cardiovascular:     Rate and Rhythm: Normal rate and regular rhythm.     Pulses: Normal pulses.     Comments: DP pulse 2+ in the RLE Pulmonary:     Effort: Pulmonary effort is normal. No respiratory distress.     Comments: Respirations even and unlabored Musculoskeletal: Normal range of motion.     Comments: Normal range of motion of the right lower extremity.  Compartments are soft, compressible.  There is no bony deformity or crepitus to the right knee or right lower extremity.  No palpable effusion to the right knee.  No erythema,  heat to touch.  Normal plantar flexion and dorsiflexion of the right foot.  Skin:    General: Skin is warm and dry.     Coloration: Skin is not pale.     Findings: No erythema or rash.     Comments: Puncture wound to the dorsal lateral aspect of the right hand.  No active bleeding or purulent drainage.  Neurological:     Mental Status: He is alert and oriented to person, place, and time.  Psychiatric:        Behavior: Behavior normal.      ED Treatments / Results  Labs (all labs ordered are listed, but only abnormal results are displayed) Labs Reviewed - No data to display  EKG None  Radiology Dg Tibia/fibula Right  Result Date: 02/14/2019 CLINICAL DATA:  Brachial dog fight with fall and right leg pain, initial encounter EXAM: RIGHT TIBIA AND FIBULA - 2 VIEW COMPARISON:  None. FINDINGS: There is no evidence of fracture or other focal bone lesions. Soft tissues are unremarkable. IMPRESSION: No acute abnormality noted. Electronically Signed   By: Alcide Clever M.D.   On: 02/14/2019 02:00   Dg Knee Complete 4 Views Right  Result Date: 02/14/2019 CLINICAL DATA:  Recent dog bites while breaking up a dog fight, initial encounter EXAM: RIGHT KNEE - COMPLETE 4+ VIEW COMPARISON:  None. FINDINGS: No evidence of fracture, dislocation, or joint effusion. No evidence of arthropathy or other focal bone abnormality. Soft tissues are unremarkable. IMPRESSION: No acute abnormality noted. Electronically Signed   By: Alcide Clever M.D.   On: 02/14/2019 02:00    Procedures Procedures (including critical care time)  Medications Ordered in ED Medications  Tdap (BOOSTRIX) injection 0.5 mL (has no administration in time range)  amoxicillin-clavulanate (AUGMENTIN) 875-125 MG per tablet 1 tablet (has no administration in time range)  naproxen (NAPROSYN) tablet 500 mg (has no administration in time range)     Initial Impression / Assessment and Plan / ED Course  I have reviewed the triage vital signs  and the nursing notes.  Pertinent labs & imaging results that were available during my care of the patient were reviewed by me and considered in my medical decision making (see chart for details).        Patient presents to the emergency department for evaluation of RLE pain after falling over a dog. Patient neurovascularly intact on exam, but is noted to have a small puncture wound to the R hand. ROM of the RLE, including R knee, is preserved. Imaging negative for fracture, dislocation, bony deformity. No swelling, erythema, heat to touch to the affected area; no concern for septic joint. Compartments in the affected extremity are soft. Plan for supportive management including RICE and NSAIDs; primary care follow  up as needed. Patient also started on Augmentin given dog bite exposure. Tdap updated. Return precautions discussed and provided. Patient discharged in stable condition with no unaddressed concerns.   Final Clinical Impressions(s) / ED Diagnoses   Final diagnoses:  Right leg pain  Puncture wound of right hand without foreign body, initial encounter  Dog bite, initial encounter    ED Discharge Orders         Ordered    amoxicillin-clavulanate (AUGMENTIN) 875-125 MG tablet  Every 12 hours     02/14/19 0301    naproxen (NAPROSYN) 500 MG tablet  Every 12 hours PRN     02/14/19 0301           Antony Madura, PA-C 02/14/19 0309    Nira Conn, MD 02/14/19 Rickey Primus

## 2019-02-14 NOTE — Discharge Instructions (Addendum)
Take Augmentin as prescribed until finished to prevent infection at the site of your puncture wound.   Ice areas of injury 3-4 times per day to limit inflammation.  Continue with rest and elevation.  Avoid strenuous activity and heavy lifting.  We recommend consistent use of naproxen for pain control.  We recommend follow-up with a primary care doctor to ensure resolution of symptoms.  You can also follow up with sports medicine, if desired.  Return to the ED for any new or concerning symptoms.

## 2019-02-15 ENCOUNTER — Encounter: Payer: Self-pay | Admitting: Family Medicine

## 2019-02-15 NOTE — Progress Notes (Deleted)
Oscar Anderson is a 33 y.o. male who presents to [facility] today for right knee injury.  Patient was trying to break up a dog fight yesterday.  He fell to the ground and felt his knee locked.  He was seen in the emergency department yesterday where x-rays of his leg were unremarkable.  He was advised to follow-up with me today.  Additionally he was bitten the hand and suffered a puncture wound.  The wound was treated with NSAIDs, Augmentin, and Tdap vaccine.    ROS:  As above  Exam:  There were no vitals taken for this visit. Wt Readings from Last 5 Encounters:  08/14/18 148 lb (67.1 kg)  05/17/18 155 lb (70.3 kg)  08/13/16 169 lb (76.7 kg)  08/09/16 166 lb (75.3 kg)  03/17/16 161 lb (73 kg)   General: Well Developed, well nourished, and in no acute distress.  Neuro/Psych: Alert and oriented x3, extra-ocular muscles intact, able to move all 4 extremities, sensation grossly intact. Skin: Warm and dry, no rashes noted.  Respiratory: Not using accessory muscles, speaking in full sentences, trachea midline.  Cardiovascular: Pulses palpable, no extremity edema. Abdomen: Does not appear distended. MSK: ***    Lab and Radiology Results No results found for this or any previous visit (from the past 72 hour(s)). Dg Tibia/fibula Right  Result Date: 02/14/2019 CLINICAL DATA:  Brachial dog fight with fall and right leg pain, initial encounter EXAM: RIGHT TIBIA AND FIBULA - 2 VIEW COMPARISON:  None. FINDINGS: There is no evidence of fracture or other focal bone lesions. Soft tissues are unremarkable. IMPRESSION: No acute abnormality noted. Electronically Signed   By: Alcide Clever M.D.   On: 02/14/2019 02:00   Dg Knee Complete 4 Views Right  Result Date: 02/14/2019 CLINICAL DATA:  Recent dog bites while breaking up a dog fight, initial encounter EXAM: RIGHT KNEE - COMPLETE 4+ VIEW COMPARISON:  None. FINDINGS: No evidence of fracture,  dislocation, or joint effusion. No evidence of arthropathy or other focal bone abnormality. Soft tissues are unremarkable. IMPRESSION: No acute abnormality noted. Electronically Signed   By: Alcide Clever M.D.   On: 02/14/2019 02:00    I personally (independently) visualized and performed the interpretation of the images attached in this note.    Assessment and Plan: 33 y.o. male with ***   PDMP not reviewed this encounter. No orders of the defined types were placed in this encounter.  No orders of the defined types were placed in this encounter.   Historical information moved to improve visibility of documentation.  Past Medical History:  Diagnosis Date  . Asthma   . Chronic back pain   . Restless leg syndrome    No past surgical history on file. Social History   Tobacco Use  . Smoking status: Current Every Day Smoker    Packs/day: 0.50    Types: Cigarettes  . Smokeless tobacco: Never Used  Substance Use Topics  . Alcohol use: No    Frequency: Never   family history is not on file.  Medications: Current Outpatient Medications  Medication Sig Dispense Refill  . albuterol (PROVENTIL HFA;VENTOLIN HFA) 108 (90 Base) MCG/ACT inhaler Inhale 1-2 puffs into the lungs every 6 (six) hours as needed for wheezing or shortness of breath.    Marland Kitchen amoxicillin-clavulanate (AUGMENTIN) 875-125 MG tablet Take 1 tablet by mouth every 12 (twelve) hours. 14 tablet 0  . HYDROcodone-acetaminophen (NORCO/VICODIN) 5-325 MG tablet Take 1 tablet by mouth  every 6 (six) hours as needed for moderate pain. (Patient not taking: Reported on 08/15/2018) 15 tablet 0  . meloxicam (MOBIC) 15 MG tablet Take 1 tablet (15 mg total) by mouth daily. (Patient not taking: Reported on 08/24/2016) 15 tablet 0  . naproxen (NAPROSYN) 500 MG tablet Take 1 tablet (500 mg total) by mouth every 12 (twelve) hours as needed for mild pain or moderate pain. 30 tablet 0   No current facility-administered medications for this  visit.    Allergies  Allergen Reactions  . Flexeril [Cyclobenzaprine]     Causes restless leg syndrome  . Lamictal [Lamotrigine]     Worsened manic behavior  . Toradol [Ketorolac Tromethamine] Itching    And swelling  . Tramadol Nausea Only and Rash      Discussed warning signs or symptoms. Please see discharge instructions. Patient expresses understanding.

## 2019-02-16 ENCOUNTER — Ambulatory Visit (INDEPENDENT_AMBULATORY_CARE_PROVIDER_SITE_OTHER): Payer: Self-pay | Admitting: Family Medicine

## 2019-02-16 ENCOUNTER — Encounter: Payer: Self-pay | Admitting: Family Medicine

## 2019-02-16 VITALS — BP 140/93 | HR 75 | Temp 97.6°F | Wt 171.0 lb

## 2019-02-16 DIAGNOSIS — M25561 Pain in right knee: Secondary | ICD-10-CM

## 2019-02-16 MED ORDER — GABAPENTIN 300 MG PO CAPS: ORAL_CAPSULE | ORAL | 3 refills | Status: DC

## 2019-02-16 MED ORDER — PREDNISONE 50 MG PO TABS: 50.0000 mg | ORAL_TABLET | Freq: Every day | ORAL | 0 refills | Status: DC
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
## 2019-02-16 NOTE — Patient Instructions (Signed)
Thank you for coming in today. You should hear about MRI soon.  You can stop by radiology downstairs on the way out to schedule.  Take gabapentin for nerve pain up to 3x daily.  Take prednisone for 5 days.  Keep me updated.  We will check back after MRI.

## 2019-02-19 ENCOUNTER — Other Ambulatory Visit: Payer: Self-pay

## 2019-09-27 ENCOUNTER — Telehealth: Payer: Self-pay

## 2019-09-27 NOTE — Telephone Encounter (Signed)
Pt's wife Marcella Dubs aware that he will need either a virtual or telephone visit with Christen Butter, FNP-BC in order for something to be prescribed. She stated he would be calling in tomorrow morning to schedule an appointment.

## 2019-09-27 NOTE — Telephone Encounter (Signed)
I will need to speak with the patient so that I can safely prescribe for him. Can you please have this patient schedule a telephone or virtual visit with me?   Thanks, Thayer Ohm, DNP, APRN, FNP-BC

## 2019-09-27 NOTE — Telephone Encounter (Signed)
Pt's wife called requesting a Rx to be sent in for her husband. She was recently dx with trichomonas and is currently being treated. She states her husband wants to be treated as well. 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 He is asymptomatic at this time.   Please send the Rx to [facility] so that it can be transferred to whatever his location is at that time.

## 2019-09-28 ENCOUNTER — Encounter: Payer: Self-pay | Admitting: Physician Assistant

## 2019-09-28 ENCOUNTER — Ambulatory Visit (INDEPENDENT_AMBULATORY_CARE_PROVIDER_SITE_OTHER): Payer: Self-pay | Admitting: Physician Assistant

## 2019-09-28 VITALS — Ht 69.0 in | Wt 190.0 lb

## 2019-09-28 DIAGNOSIS — Z202 Contact with and (suspected) exposure to infections with a predominantly sexual mode of transmission: Secondary | ICD-10-CM

## 2019-09-28 MED ORDER — METRONIDAZOLE 500 MG PO TABS: 500.0000 mg | ORAL_TABLET | Freq: Two times a day (BID) | ORAL | 0 refills | Status: DC

## 2019-09-28 NOTE — Progress Notes (Signed)
Patient's wife seen by Ander Slade and diagnosed with trich, patient will need treatment.  He is not having any symptoms.

## 2019-09-28 NOTE — Progress Notes (Signed)
Patient ID: Oscar Anderson, male   DOB: 1986/03/28, 34 y.o.   MRN: [id number] .Virtual Visit via Video Note  I connected with Oscar Anderson on 09/28/19 at 11:30 AM EST by a video enabled telemedicine application and verified that I am speaking with the correct person using two identifiers.  Location: Patient: work Provider: clinic   I discussed the limitations of evaluation and management by telemedicine and the availability of in person appointments. The patient expressed understanding and agreed to proceed.  History of Present Illness: Patient is a 34 year old male who calls into the clinic to discuss treatment for trichomonas.  His wife was seen earlier this week in our office and tested positive for trichomonas.  She was also tested and negative for gonorrhea and chlamydia.  He would like to be treated.  He is currently asymptomatic.    .. Active Ambulatory Problems    Diagnosis Date Noted  . Pilonidal cyst 01/21/2016  . Bipolar I disorder (HCC) 02/18/2016  . Lumbago 08/09/2016  . Lumbar radiculopathy 08/10/2016   Resolved Ambulatory Problems    Diagnosis Date Noted  . No Resolved Ambulatory Problems   Past Medical History:  Diagnosis Date  . Asthma   . Chronic back pain   . Restless leg syndrome    Reviewed med, allergies, problem list.   Observations/Objective: No acute distress. Normal mood and appearance.   .. Today's Vitals   09/28/19 1126  Weight: 190 lb (86.2 kg)  Height: 5\' 9"  (1.753 m)   Body mass index is 28.06 kg/m.    Assessment and Plan: Marland KitchenHakeen was seen today for exposure to std.  Diagnoses and all orders for this visit:  Exposure to STD -     metroNIDAZOLE (FLAGYL) 500 MG tablet; Take 1 tablet (500 mg total) by mouth 2 (two) times daily.   Patient's wife was positive for trichomonas. Reviewed patients wife chart and confirmed tested for all STD. Patient is asymptomatic.  We will treat with metronidazole 500 mg twice  daily for 7 days.  Patient aware to abstain from sexual intercourse for the next 7 days.  Follow-up as needed.   Follow Up Instructions:    I discussed the assessment and treatment plan with the patient. The patient was provided an opportunity to ask questions and all were answered. The patient agreed with the plan and demonstrated an understanding of the instructions.   The patient was advised to call back or seek an in-person evaluation if the symptoms worsen or if the condition fails to improve as anticipated.    Cristal Deer, PA-C
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
## 2021-06-11 ENCOUNTER — Encounter (HOSPITAL_BASED_OUTPATIENT_CLINIC_OR_DEPARTMENT_OTHER): Payer: Self-pay | Admitting: *Deleted

## 2021-06-11 ENCOUNTER — Emergency Department (HOSPITAL_BASED_OUTPATIENT_CLINIC_OR_DEPARTMENT_OTHER): Payer: Self-pay

## 2021-06-11 ENCOUNTER — Emergency Department (HOSPITAL_BASED_OUTPATIENT_CLINIC_OR_DEPARTMENT_OTHER)
Admission: EM | Admit: 2021-06-11 | Discharge: 2021-06-11 | Disposition: A | Discharge status: Home / Self Care | Payer: Self-pay | Attending: Emergency Medicine | Admitting: Emergency Medicine

## 2021-06-11 ENCOUNTER — Other Ambulatory Visit: Payer: Self-pay

## 2021-06-11 DIAGNOSIS — J45909 Unspecified asthma, uncomplicated: Secondary | ICD-10-CM | POA: Insufficient documentation

## 2021-06-11 DIAGNOSIS — S62522A Displaced fracture of distal phalanx of left thumb, initial encounter for closed fracture: Secondary | ICD-10-CM | POA: Insufficient documentation

## 2021-06-11 DIAGNOSIS — F17210 Nicotine dependence, cigarettes, uncomplicated: Secondary | ICD-10-CM | POA: Insufficient documentation

## 2021-06-11 DIAGNOSIS — W231XXA Caught, crushed, jammed, or pinched between stationary objects, initial encounter: Secondary | ICD-10-CM | POA: Insufficient documentation

## 2021-06-11 DIAGNOSIS — S61011A Laceration without foreign body of right thumb without damage to nail, initial encounter: Secondary | ICD-10-CM | POA: Insufficient documentation

## 2021-06-11 MED ORDER — OXYCODONE-ACETAMINOPHEN 5-325 MG PO TABS
2.0000 | ORAL_TABLET | Freq: Once | ORAL | Status: AC
  Administered 2021-06-11: 2 via ORAL
  Filled 2021-06-11: qty 2

## 2021-06-11 MED ORDER — TETANUS-DIPHTH-ACELL PERTUSSIS 5-2.5-18.5 LF-MCG/0.5 IM SUSY: 0.5000 mL | PREFILLED_SYRINGE | Freq: Once | INTRAMUSCULAR | Status: DC

## 2021-06-11 MED ORDER — CEPHALEXIN 500 MG PO CAPS: 500.0000 mg | ORAL_CAPSULE | Freq: Two times a day (BID) | ORAL | 0 refills | Status: DC

## 2021-06-11 MED ORDER — HYDROCODONE-ACETAMINOPHEN 5-325 MG PO TABS: 2.0000 | ORAL_TABLET | Freq: Four times a day (QID) | ORAL | 0 refills | Status: DC | PRN
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
## 2021-06-11 NOTE — Discharge Instructions (Addendum)
As we discussed there is no fracture to your right thumb, however I recommend you take the entire course of antibiotics that I prescribed to prevent infection.  It is possible that you have some injury to your tendon, or some deep tissue bruising.  I recommend resting the affected thumb, applying ice, keeping your hands elevated above your heart as much as you are able to.  There is a fracture on your left thumb of the bone at the tip of your thumb.  Please follow-up with your orthopedic doctor whose contact information attached.  Please leave your thumb in the splint until you can see the orthopedic doctor.  Please keep your splint dry when showering.  Please use Tylenol or ibuprofen for pain.  You may use 600 mg ibuprofen every 6 hours or 1000 mg of Tylenol every 6 hours.  You may choose to alternate between the 2.  This would be most effective.  Not to exceed 4 g of Tylenol within 24 hours.  Not to exceed 3200 mg ibuprofen 24 hours.  Additionally you can use the pain medication I prescribed in place of tylenol as it already contains tylenol.
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
## 2022-07-14 ENCOUNTER — Emergency Department (HOSPITAL_BASED_OUTPATIENT_CLINIC_OR_DEPARTMENT_OTHER)
Admission: EM | Admit: 2022-07-14 | Discharge: 2022-07-14 | Disposition: A | Discharge status: Home / Self Care | Payer: Self-pay | Attending: Emergency Medicine | Admitting: Emergency Medicine

## 2022-07-14 ENCOUNTER — Encounter (HOSPITAL_BASED_OUTPATIENT_CLINIC_OR_DEPARTMENT_OTHER): Payer: Self-pay | Admitting: Emergency Medicine

## 2022-07-14 ENCOUNTER — Other Ambulatory Visit: Payer: Self-pay

## 2022-07-14 ENCOUNTER — Emergency Department (HOSPITAL_BASED_OUTPATIENT_CLINIC_OR_DEPARTMENT_OTHER): Payer: Self-pay | Admitting: Radiology

## 2022-07-14 DIAGNOSIS — F17210 Nicotine dependence, cigarettes, uncomplicated: Secondary | ICD-10-CM | POA: Insufficient documentation

## 2022-07-14 DIAGNOSIS — T63444A Toxic effect of venom of bees, undetermined, initial encounter: Secondary | ICD-10-CM | POA: Insufficient documentation

## 2022-07-14 DIAGNOSIS — M7989 Other specified soft tissue disorders: Secondary | ICD-10-CM

## 2022-07-14 DIAGNOSIS — M25532 Pain in left wrist: Secondary | ICD-10-CM

## 2022-07-14 DIAGNOSIS — J45909 Unspecified asthma, uncomplicated: Secondary | ICD-10-CM | POA: Insufficient documentation

## 2022-07-14 MED ORDER — OXYCODONE HCL 5 MG PO TABS
5.0000 mg | ORAL_TABLET | Freq: Once | ORAL | Status: AC
  Administered 2022-07-14: 5 mg via ORAL
  Filled 2022-07-14: qty 1

## 2022-07-14 MED ORDER — DIPHENHYDRAMINE HCL 25 MG PO CAPS
50.0000 mg | ORAL_CAPSULE | Freq: Once | ORAL | Status: AC
  Administered 2022-07-14: 50 mg via ORAL
  Filled 2022-07-14: qty 2

## 2022-07-14 NOTE — Discharge Instructions (Addendum)
You were evaluated in the Emergency Department and after careful evaluation, we did not find any emergent condition requiring admission or further testing in the hospital.  Your exam/testing today was overall reassuring.  X-ray without signs of broken bones.  Suspect your pain is related to a sprain that you reaggravated.  Suspect your swelling is related to the bee sting.  Continue Benadryl as needed, elevate the arm, recommend follow-up with the orthopedic specialist.  Please return to the Emergency Department if you experience any worsening of your condition.  Thank you for allowing [place] to be a part of your care.
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
## 2022-07-14 NOTE — ED Triage Notes (Signed)
Left arm swelling. Stung by bee around 11 AM, arm is now swelling and painful.  Also reports injury a few weeks ago to same wrist.  Too painful to sleep

## 2022-10-06 ENCOUNTER — Ambulatory Visit (HOSPITAL_COMMUNITY)
Admission: EM | Admit: 2022-10-06 | Discharge: 2022-10-06 | Disposition: A | Discharge status: Home / Self Care | Payer: 59 | Attending: Emergency Medicine | Admitting: Emergency Medicine

## 2022-10-06 ENCOUNTER — Encounter (HOSPITAL_COMMUNITY): Payer: Self-pay

## 2022-10-06 ENCOUNTER — Emergency Department (HOSPITAL_COMMUNITY): Payer: 59 | Admitting: Certified Registered Nurse Anesthetist

## 2022-10-06 ENCOUNTER — Other Ambulatory Visit: Payer: Self-pay

## 2022-10-06 ENCOUNTER — Emergency Department (HOSPITAL_COMMUNITY): Payer: 59

## 2022-10-06 ENCOUNTER — Emergency Department (EMERGENCY_DEPARTMENT_HOSPITAL): Payer: 59 | Admitting: Certified Registered Nurse Anesthetist

## 2022-10-06 ENCOUNTER — Encounter (HOSPITAL_COMMUNITY)
Admission: EM | Disposition: A | Discharge status: Home / Self Care | Payer: Self-pay | Source: Home / Self Care | Attending: Emergency Medicine

## 2022-10-06 DIAGNOSIS — S3133XA Puncture wound without foreign body of scrotum and testes, initial encounter: Secondary | ICD-10-CM

## 2022-10-06 DIAGNOSIS — W3400XA Accidental discharge from unspecified firearms or gun, initial encounter: Secondary | ICD-10-CM

## 2022-10-06 DIAGNOSIS — Y92007 Garden or yard of unspecified non-institutional (private) residence as the place of occurrence of the external cause: Secondary | ICD-10-CM | POA: Insufficient documentation

## 2022-10-06 DIAGNOSIS — Z23 Encounter for immunization: Secondary | ICD-10-CM | POA: Diagnosis not present

## 2022-10-06 DIAGNOSIS — F17210 Nicotine dependence, cigarettes, uncomplicated: Secondary | ICD-10-CM | POA: Diagnosis not present

## 2022-10-06 HISTORY — DX: Unspecified asthma, uncomplicated: J45.909

## 2022-10-06 HISTORY — PX: SCROTAL EXPLORATION: SHX2386

## 2022-10-06 HISTORY — PX: ORCHIECTOMY: SHX2116

## 2022-10-06 HISTORY — DX: Cardiac arrhythmia, unspecified: I49.9

## 2022-10-06 LAB — COMPREHENSIVE METABOLIC PANEL
ALT: 22 U/L (ref 0–44)
AST: 25 U/L (ref 15–41)
Albumin: 4.3 g/dL (ref 3.5–5.0)
Alkaline Phosphatase: 80 U/L (ref 38–126)
Anion gap: 12 (ref 5–15)
BUN: 23 mg/dL — ABNORMAL HIGH (ref 6–20)
CO2: 23 mmol/L (ref 22–32)
Calcium: 9.3 mg/dL (ref 8.9–10.3)
Chloride: 101 mmol/L (ref 98–111)
Creatinine, Ser: 1.05 mg/dL (ref 0.61–1.24)
GFR, Estimated: >60 mL/min (ref >60)
Glucose, Bld: 151 mg/dL — ABNORMAL HIGH (ref 70–99)
Potassium: 4 mmol/L (ref 3.5–5.1)
Sodium: 136 mmol/L (ref 135–145)
Total Bilirubin: 0.6 mg/dL (ref 0.3–1.2)
Total Protein: 6.8 g/dL (ref 6.5–8.1)

## 2022-10-06 LAB — LACTIC ACID, PLASMA: Lactic Acid, Venous: 2 mmol/L (ref 0.5–1.9)

## 2022-10-06 LAB — CBC
HCT: 42.2 % (ref 39.0–52.0)
Hemoglobin: 14.6 g/dL (ref 13.0–17.0)
MCH: 29.7 pg (ref 26.0–34.0)
MCHC: 34.6 g/dL (ref 30.0–36.0)
MCV: 85.8 fL (ref 80.0–100.0)
Platelets: 299 10*3/uL (ref 150–400)
RBC: 4.92 MIL/uL (ref 4.22–5.81)
RDW: 11.3 % — ABNORMAL LOW (ref 11.5–15.5)
WBC: 12.6 10*3/uL — ABNORMAL HIGH (ref 4.0–10.5)
nRBC: 0 % (ref 0.0–0.2)

## 2022-10-06 LAB — I-STAT CHEM 8, ED
BUN: 25 mg/dL — ABNORMAL HIGH (ref 6–20)
Calcium, Ion: 1.16 mmol/L (ref 1.15–1.40)
Chloride: 103 mmol/L (ref 98–111)
Creatinine, Ser: 0.9 mg/dL (ref 0.61–1.24)
Glucose, Bld: 145 mg/dL — ABNORMAL HIGH (ref 70–99)
HCT: 44 % (ref 39.0–52.0)
Hemoglobin: 15 g/dL (ref 13.0–17.0)
Potassium: 4 mmol/L (ref 3.5–5.1)
Sodium: 138 mmol/L (ref 135–145)
TCO2: 23 mmol/L (ref 22–32)

## 2022-10-06 LAB — PROTIME-INR
INR: 1 (ref 0.8–1.2)
Prothrombin Time: 13.4 seconds (ref 11.4–15.2)

## 2022-10-06 LAB — I-STAT BETA HCG BLOOD, ED (MC, WL, AP ONLY): I-stat hCG, quantitative: <5 m[IU]/mL (ref <5)

## 2022-10-06 LAB — ETHANOL: Alcohol, Ethyl (B): <10 mg/dL (ref <10)

## 2022-10-06 LAB — SAMPLE TO BLOOD BANK

## 2022-10-06 SURGERY — EXPLORATION, SCROTUM
Anesthesia: General | Site: Scrotum

## 2022-10-06 MED ORDER — ACETAMINOPHEN 10 MG/ML IV SOLN
INTRAVENOUS | Status: AC
  Filled 2022-10-06: qty 100

## 2022-10-06 MED ORDER — FENTANYL CITRATE (PF) 100 MCG/2ML IJ SOLN: 25.0000 ug | INTRAMUSCULAR | Status: DC | PRN

## 2022-10-06 MED ORDER — BUPIVACAINE HCL (PF) 0.25 % IJ SOLN
INTRAMUSCULAR | Status: DC | PRN
  Administered 2022-10-06: 20 mL

## 2022-10-06 MED ORDER — TETANUS-DIPHTH-ACELL PERTUSSIS 5-2.5-18.5 LF-MCG/0.5 IM SUSY
0.5000 mL | PREFILLED_SYRINGE | Freq: Once | INTRAMUSCULAR | Status: AC
  Administered 2022-10-06: 0.5 mL via INTRAMUSCULAR
  Filled 2022-10-06: qty 0.5

## 2022-10-06 MED ORDER — SULFAMETHOXAZOLE-TRIMETHOPRIM 800-160 MG PO TABS: 1.0000 | ORAL_TABLET | Freq: Two times a day (BID) | ORAL | 0 refills | Status: DC

## 2022-10-06 MED ORDER — ONDANSETRON HCL 4 MG/2ML IJ SOLN
INTRAMUSCULAR | Status: DC | PRN
  Administered 2022-10-06: 4 mg via INTRAVENOUS

## 2022-10-06 MED ORDER — TETANUS-DIPHTH-ACELL PERTUSSIS 5-2.5-18.5 LF-MCG/0.5 IM SUSY
PREFILLED_SYRINGE | INTRAMUSCULAR | Status: AC
  Filled 2022-10-06: qty 0.5

## 2022-10-06 MED ORDER — FENTANYL CITRATE (PF) 250 MCG/5ML IJ SOLN
INTRAMUSCULAR | Status: DC | PRN
  Administered 2022-10-06: 50 ug via INTRAVENOUS
  Administered 2022-10-06 (×2): 75 ug via INTRAVENOUS
  Administered 2022-10-06: 50 ug via INTRAVENOUS

## 2022-10-06 MED ORDER — LIDOCAINE 2% (20 MG/ML) 5 ML SYRINGE
INTRAMUSCULAR | Status: DC | PRN
  Administered 2022-10-06: 80 mg via INTRAVENOUS

## 2022-10-06 MED ORDER — SUCCINYLCHOLINE CHLORIDE 200 MG/10ML IV SOSY
PREFILLED_SYRINGE | INTRAVENOUS | Status: AC
  Filled 2022-10-06: qty 10

## 2022-10-06 MED ORDER — ONDANSETRON HCL 4 MG/2ML IJ SOLN
INTRAMUSCULAR | Status: AC
  Filled 2022-10-06: qty 2

## 2022-10-06 MED ORDER — BUPIVACAINE HCL (PF) 0.25 % IJ SOLN
INTRAMUSCULAR | Status: AC
  Filled 2022-10-06: qty 90

## 2022-10-06 MED ORDER — MIDAZOLAM HCL 5 MG/5ML IJ SOLN
INTRAMUSCULAR | Status: DC | PRN
  Administered 2022-10-06: 2 mg via INTRAVENOUS

## 2022-10-06 MED ORDER — SUCCINYLCHOLINE CHLORIDE 200 MG/10ML IV SOSY
PREFILLED_SYRINGE | INTRAVENOUS | Status: DC | PRN
  Administered 2022-10-06: 120 mg via INTRAVENOUS

## 2022-10-06 MED ORDER — DEXAMETHASONE SODIUM PHOSPHATE 10 MG/ML IJ SOLN
INTRAMUSCULAR | Status: AC
  Filled 2022-10-06: qty 1

## 2022-10-06 MED ORDER — SUGAMMADEX SODIUM 200 MG/2ML IV SOLN
INTRAVENOUS | Status: DC | PRN
  Administered 2022-10-06: 200 mg via INTRAVENOUS

## 2022-10-06 MED ORDER — FENTANYL CITRATE PF 50 MCG/ML IJ SOSY
50.0000 ug | PREFILLED_SYRINGE | Freq: Once | INTRAMUSCULAR | Status: AC
  Administered 2022-10-06: 50 ug via INTRAVENOUS
  Filled 2022-10-06: qty 1

## 2022-10-06 MED ORDER — LIDOCAINE 2% (20 MG/ML) 5 ML SYRINGE
INTRAMUSCULAR | Status: AC
  Filled 2022-10-06: qty 5

## 2022-10-06 MED ORDER — FENTANYL CITRATE PF 50 MCG/ML IJ SOSY: 50.0000 ug | PREFILLED_SYRINGE | Freq: Once | INTRAMUSCULAR | Status: AC

## 2022-10-06 MED ORDER — ACETAMINOPHEN 10 MG/ML IV SOLN: 1000.0000 mg | Freq: Once | INTRAVENOUS | Status: DC | PRN

## 2022-10-06 MED ORDER — ROCURONIUM BROMIDE 10 MG/ML (PF) SYRINGE
PREFILLED_SYRINGE | INTRAVENOUS | Status: DC | PRN
  Administered 2022-10-06: 30 mg via INTRAVENOUS

## 2022-10-06 MED ORDER — FENTANYL CITRATE (PF) 250 MCG/5ML IJ SOLN
INTRAMUSCULAR | Status: AC
  Filled 2022-10-06: qty 5

## 2022-10-06 MED ORDER — ACETAMINOPHEN 10 MG/ML IV SOLN
1000.0000 mg | Freq: Once | INTRAVENOUS | Status: DC | PRN
  Administered 2022-10-06: 1000 mg via INTRAVENOUS

## 2022-10-06 MED ORDER — KETAMINE HCL-SODIUM CHLORIDE 100-0.9 MG/10ML-% IV SOSY
PREFILLED_SYRINGE | INTRAVENOUS | Status: DC | PRN
  Administered 2022-10-06: 20 mg via INTRAVENOUS
  Administered 2022-10-06: 10 mg via INTRAVENOUS

## 2022-10-06 MED ORDER — MIDAZOLAM HCL 2 MG/2ML IJ SOLN
INTRAMUSCULAR | Status: AC
  Filled 2022-10-06: qty 2

## 2022-10-06 MED ORDER — PROPOFOL 10 MG/ML IV BOLUS
INTRAVENOUS | Status: AC
  Filled 2022-10-06: qty 20

## 2022-10-06 MED ORDER — HYDROCODONE-ACETAMINOPHEN 5-325 MG PO TABS: 1.0000 | ORAL_TABLET | ORAL | 0 refills | Status: AC | PRN

## 2022-10-06 MED ORDER — FENTANYL CITRATE PF 50 MCG/ML IJ SOSY
PREFILLED_SYRINGE | INTRAMUSCULAR | Status: AC
  Administered 2022-10-06: 50 ug via INTRAVENOUS
  Filled 2022-10-06: qty 1

## 2022-10-06 MED ORDER — BUPIVACAINE HCL (PF) 0.5 % IJ SOLN
INTRAMUSCULAR | Status: AC
  Filled 2022-10-06: qty 90

## 2022-10-06 MED ORDER — LACTATED RINGERS IV SOLN: INTRAVENOUS | Status: DC | PRN

## 2022-10-06 MED ORDER — CEFAZOLIN SODIUM-DEXTROSE 2-4 GM/100ML-% IV SOLN
2.0000 g | Freq: Three times a day (TID) | INTRAVENOUS | Status: DC
  Administered 2022-10-06: 2 g via INTRAVENOUS
  Filled 2022-10-06: qty 100

## 2022-10-06 MED ORDER — ROCURONIUM BROMIDE 10 MG/ML (PF) SYRINGE
PREFILLED_SYRINGE | INTRAVENOUS | Status: AC
  Filled 2022-10-06: qty 10

## 2022-10-06 MED ORDER — PROPOFOL 10 MG/ML IV BOLUS
INTRAVENOUS | Status: DC | PRN
  Administered 2022-10-06: 30 mg via INTRAVENOUS
  Administered 2022-10-06: 170 mg via INTRAVENOUS

## 2022-10-06 MED ORDER — 0.9 % SODIUM CHLORIDE (POUR BTL) OPTIME
TOPICAL | Status: DC | PRN
  Administered 2022-10-06: 1000 mL

## 2022-10-06 MED ORDER — FENTANYL CITRATE PF 50 MCG/ML IJ SOSY: 50.0000 ug | PREFILLED_SYRINGE | Freq: Once | INTRAMUSCULAR | Status: DC

## 2022-10-06 MED ORDER — DEXAMETHASONE SODIUM PHOSPHATE 10 MG/ML IJ SOLN
INTRAMUSCULAR | Status: DC | PRN
  Administered 2022-10-06: 4 mg via INTRAVENOUS

## 2022-10-06 MED ORDER — KETAMINE HCL 50 MG/5ML IJ SOSY
PREFILLED_SYRINGE | INTRAMUSCULAR | Status: AC
  Filled 2022-10-06: qty 5

## 2022-10-06 SURGICAL SUPPLY — 26 items
BAG COUNTER SPONGE SURGICOUNT (BAG) IMPLANT
BLADE CLIPPER SURG (BLADE) ×2 IMPLANT
BLADE HEX COATED 2.75 (ELECTRODE) ×2 IMPLANT
BNDG GAUZE DERMACEA FLUFF 4 (GAUZE/BANDAGES/DRESSINGS) ×2 IMPLANT
COVER SURGICAL LIGHT HANDLE (MISCELLANEOUS) ×2 IMPLANT
DERMABOND ADVANCED .7 DNX12 (GAUZE/BANDAGES/DRESSINGS) ×2 IMPLANT
DRAIN PENROSE 0.25X18 (DRAIN) IMPLANT
DRAPE LAPAROTOMY 100X72 PEDS (DRAPES) ×2 IMPLANT
ELECT REM PT RETURN 15FT ADLT (MISCELLANEOUS) ×2 IMPLANT
GAUZE SPONGE 4X4 12PLY STRL (GAUZE/BANDAGES/DRESSINGS) IMPLANT
GLOVE BIO SURGEON STRL SZ7.5 (GLOVE) ×4 IMPLANT
GOWN STRL REUS W/ TWL LRG LVL3 (GOWN DISPOSABLE) ×2 IMPLANT
GOWN STRL REUS W/TWL LRG LVL3 (GOWN DISPOSABLE) ×2
KIT BASIN OR (CUSTOM PROCEDURE TRAY) ×2 IMPLANT
NEEDLE HYPO 22GX1.5 SAFETY (NEEDLE) IMPLANT
NS IRRIG 1000ML POUR BTL (IV SOLUTION) ×2 IMPLANT
PACK GENERAL/GYN (CUSTOM PROCEDURE TRAY) ×2 IMPLANT
SOL PREP POV-IOD 4OZ 10% (MISCELLANEOUS) ×2 IMPLANT
SUPPORT SCROTAL LG STRP (MISCELLANEOUS) ×2 IMPLANT
SUT CHROMIC 3 0 SH 27 (SUTURE) ×4 IMPLANT
SUT SILK 2 0 TIES 10X30 (SUTURE) IMPLANT
SUT VIC AB 2-0 UR5 27 (SUTURE) IMPLANT
SUT VICRYL 0 TIES 12 18 (SUTURE) IMPLANT
SYR CONTROL 10ML LL (SYRINGE) IMPLANT
TOWEL GREEN STERILE (TOWEL DISPOSABLE) ×4 IMPLANT
WATER STERILE IRR 1000ML POUR (IV SOLUTION) ×2 IMPLANT

## 2022-10-06 NOTE — Discharge Instructions (Signed)
Discharge instructions following scrotal surgery  Call your doctor for: Fever is greater than 100.5 Severe nausea or vomiting Increasing pain not controlled by pain medication Increasing redness or drainage from incisions  The number for questions or concerns is [phone]  Activity level: No lifting greater than 20 pounds (about equal to milk) for the next 2 weeks or until cleared to do so at follow-up appointment.  Otherwise activity as tolerated by comfort level.  Diet: May resume your regular diet as tolerated  Driving: No driving while still taking opiate pain medications (weight at least 6-8 hours after last dose).  No driving if you still sore from surgery as it may limit her ability to react quickly if necessary.   Shower/bath: May shower and get incision wet pad dry immediately following.  Do not scrub vigorously for the next 2-3 weeks.  Do not soak incision (ID soaking in bath or swimming) until told he may do so by Dr., as this may promote a wound infection.  Wound care: He may cover wounds with sterile gauze as needed to prevent incisions rubbing on close follow-up in any seepage.  Where tight fitting underpants/scrotal support for at least 2 weeks.  He should apply cold compresses (ice or sac of frozen peas/corn) to your scrotum for at least 48 hours to reduce the swelling for 15 minutes at a time indirectly.  You should expect that his scrotum will swell up initially and then get smaller over the next 2-4 weeks.  Follow-up appointments: Follow-up appointment will be scheduled with Dr. Chasty Randal for a wound check.  
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
## 2022-10-06 NOTE — ED Notes (Signed)
Trauma end  
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
## 2022-10-06 NOTE — Transfer of Care (Signed)
Immediate Anesthesia Transfer of Care Note  Patient: Oscar Anderson  Procedure(s) Performed: Peter Congo EXPLORATION (Scrotum) LEFT ORCHIECTOMY (Left: Scrotum)  Patient Location: PACU  Anesthesia Type:General  Level of Consciousness: sedated and drowsy  Airway & Oxygen Therapy: Patient Spontanous Breathing and Patient connected to nasal cannula oxygen, OPA in place  Post-op Assessment: Report given to RN and Post -op Vital signs reviewed and stable  Post vital signs: Reviewed and stable  Last Vitals:  Vitals Value Taken Time  BP 121/73 10/06/22 0930  Temp    Pulse 82 10/06/22 0932  Resp 11 10/06/22 0932  SpO2 98 % 10/06/22 0932  Vitals shown include unvalidated device data.  Last Pain:  Vitals:   10/06/22 0753  TempSrc: Oral  PainSc: 10-Worst pain ever         Complications: No notable events documented.
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
## 2022-10-06 NOTE — Anesthesia Procedure Notes (Signed)
Procedure Name: Intubation Date/Time: 10/06/2022 8:17 AM  Performed by: Lowella Dell, CRNAPre-anesthesia Checklist: Patient identified, Emergency Drugs available, Suction available and Patient being monitored Patient Re-evaluated:Patient Re-evaluated prior to induction Oxygen Delivery Method: Circle System Utilized Preoxygenation: Pre-oxygenation with 100% oxygen Induction Type: IV induction and Rapid sequence Laryngoscope Size: Mac and 4 Grade View: Grade II Tube type: Oral Tube size: 7.5 mm Number of attempts: 1 Airway Equipment and Method: Stylet and Bite block Placement Confirmation: ETT inserted through vocal cords under direct vision, positive ETCO2 and breath sounds checked- equal and bilateral Secured at: 22 cm Tube secured with: Tape Dental Injury: Teeth and Oropharynx as per pre-operative assessment

## 2022-10-07 ENCOUNTER — Encounter (HOSPITAL_COMMUNITY): Payer: Self-pay | Admitting: Urology

## 2022-10-07 NOTE — Anesthesia Postprocedure Evaluation (Signed)
Anesthesia Post Note  Patient: Oscar Anderson  Procedure(s) Performed: Peter Congo EXPLORATION (Scrotum) LEFT ORCHIECTOMY (Left: Scrotum)     Patient location during evaluation: PACU Anesthesia Type: General Level of consciousness: awake and alert Pain management: pain level controlled Vital Signs Assessment: post-procedure vital signs reviewed and stable Respiratory status: spontaneous breathing, nonlabored ventilation, respiratory function stable and patient connected to nasal cannula oxygen Cardiovascular status: blood pressure returned to baseline and stable Postop Assessment: no apparent nausea or vomiting Anesthetic complications: no   No notable events documented.  Last Vitals:  Vitals:   10/06/22 1015 10/06/22 1030  BP: 136/75 130/85  Pulse: 69 77  Resp: 12 14  Temp:  36.6 C  SpO2: 96% 97%    Last Pain:  Vitals:   10/06/22 1030  TempSrc:   PainSc: 0-No pain                 Belenda Cruise P Sheyna Pettibone

## 2022-10-10 LAB — SURGICAL PATHOLOGY

## 2022-10-25 ENCOUNTER — Encounter (HOSPITAL_BASED_OUTPATIENT_CLINIC_OR_DEPARTMENT_OTHER): Payer: Self-pay | Admitting: Emergency Medicine

## 2022-10-25 ENCOUNTER — Other Ambulatory Visit: Payer: Self-pay

## 2022-10-25 ENCOUNTER — Encounter (HOSPITAL_BASED_OUTPATIENT_CLINIC_OR_DEPARTMENT_OTHER): Payer: Self-pay

## 2022-10-25 ENCOUNTER — Emergency Department (HOSPITAL_BASED_OUTPATIENT_CLINIC_OR_DEPARTMENT_OTHER)
Admission: EM | Admit: 2022-10-25 | Discharge: 2022-10-26 | Discharge status: Against Medical Advice | Payer: Self-pay | Attending: Emergency Medicine | Admitting: Emergency Medicine

## 2022-10-25 DIAGNOSIS — R2241 Localized swelling, mass and lump, right lower limb: Secondary | ICD-10-CM | POA: Insufficient documentation

## 2022-10-25 DIAGNOSIS — X58XXXA Exposure to other specified factors, initial encounter: Secondary | ICD-10-CM | POA: Insufficient documentation

## 2022-10-25 DIAGNOSIS — R2231 Localized swelling, mass and lump, right upper limb: Secondary | ICD-10-CM | POA: Insufficient documentation

## 2022-10-25 DIAGNOSIS — Z5321 Procedure and treatment not carried out due to patient leaving prior to being seen by health care provider: Secondary | ICD-10-CM | POA: Insufficient documentation

## 2022-10-25 DIAGNOSIS — R519 Headache, unspecified: Secondary | ICD-10-CM | POA: Insufficient documentation

## 2022-10-25 DIAGNOSIS — S025XXA Fracture of tooth (traumatic), initial encounter for closed fracture: Secondary | ICD-10-CM | POA: Insufficient documentation

## 2022-10-25 NOTE — ED Notes (Signed)
1 set of blood cx in lab if needed.
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
## 2022-10-26 LAB — LACTIC ACID, PLASMA: Lactic Acid, Venous: 1.8 mmol/L (ref 0.5–1.9)

## 2022-10-26 LAB — COMPREHENSIVE METABOLIC PANEL
ALT: 20 U/L (ref 0–44)
AST: 32 U/L (ref 15–41)
Albumin: 4.5 g/dL (ref 3.5–5.0)
Alkaline Phosphatase: 83 U/L (ref 38–126)
Anion gap: 10 (ref 5–15)
BUN: 18 mg/dL (ref 6–20)
CO2: 23 mmol/L (ref 22–32)
Calcium: 9.5 mg/dL (ref 8.9–10.3)
Chloride: 104 mmol/L (ref 98–111)
Creatinine, Ser: 0.78 mg/dL (ref 0.61–1.24)
GFR, Estimated: >60 mL/min (ref >60)
Glucose, Bld: 141 mg/dL — ABNORMAL HIGH (ref 70–99)
Potassium: 3.8 mmol/L (ref 3.5–5.1)
Sodium: 137 mmol/L (ref 135–145)
Total Bilirubin: 0.3 mg/dL (ref 0.3–1.2)
Total Protein: 7.3 g/dL (ref 6.5–8.1)

## 2022-10-26 LAB — CBC WITH DIFFERENTIAL/PLATELET
Abs Immature Granulocytes: 0.02 10*3/uL (ref 0.00–0.07)
Basophils Absolute: 0 10*3/uL (ref 0.0–0.1)
Basophils Relative: 1 %
Eosinophils Absolute: 0.2 10*3/uL (ref 0.0–0.5)
Eosinophils Relative: 2 %
HCT: 40.1 % (ref 39.0–52.0)
Hemoglobin: 13.9 g/dL (ref 13.0–17.0)
Immature Granulocytes: 0 %
Lymphocytes Relative: 35 %
Lymphs Abs: 2.9 10*3/uL (ref 0.7–4.0)
MCH: 29.2 pg (ref 26.0–34.0)
MCHC: 34.7 g/dL (ref 30.0–36.0)
MCV: 84.2 fL (ref 80.0–100.0)
Monocytes Absolute: 0.9 10*3/uL (ref 0.1–1.0)
Monocytes Relative: 11 %
Neutro Abs: 4.3 10*3/uL (ref 1.7–7.7)
Neutrophils Relative %: 51 %
Platelets: 340 10*3/uL (ref 150–400)
RBC: 4.76 MIL/uL (ref 4.22–5.81)
RDW: 11.6 % (ref 11.5–15.5)
WBC: 8.4 10*3/uL (ref 4.0–10.5)
nRBC: 0 % (ref 0.0–0.2)

## 2022-10-26 NOTE — ED Notes (Signed)
Attempted to locate pt for room assignment. Unable to locate pt.

## 2022-10-26 NOTE — ED Notes (Signed)
Pt called for room, unable to locate pt.

## 2023-03-18 ENCOUNTER — Emergency Department (HOSPITAL_BASED_OUTPATIENT_CLINIC_OR_DEPARTMENT_OTHER): Payer: Self-pay | Admitting: Radiology

## 2023-03-18 ENCOUNTER — Encounter (HOSPITAL_BASED_OUTPATIENT_CLINIC_OR_DEPARTMENT_OTHER): Payer: Self-pay | Admitting: Emergency Medicine

## 2023-03-18 ENCOUNTER — Other Ambulatory Visit: Payer: Self-pay

## 2023-03-18 ENCOUNTER — Emergency Department (HOSPITAL_BASED_OUTPATIENT_CLINIC_OR_DEPARTMENT_OTHER)
Admission: EM | Admit: 2023-03-18 | Discharge: 2023-03-18 | Disposition: A | Discharge status: Home / Self Care | Payer: Self-pay | Attending: Emergency Medicine | Admitting: Emergency Medicine

## 2023-03-18 ENCOUNTER — Other Ambulatory Visit (HOSPITAL_BASED_OUTPATIENT_CLINIC_OR_DEPARTMENT_OTHER): Payer: Self-pay

## 2023-03-18 DIAGNOSIS — S61214A Laceration without foreign body of right ring finger without damage to nail, initial encounter: Secondary | ICD-10-CM | POA: Insufficient documentation

## 2023-03-18 DIAGNOSIS — W228XXA Striking against or struck by other objects, initial encounter: Secondary | ICD-10-CM | POA: Insufficient documentation

## 2023-03-18 DIAGNOSIS — L03113 Cellulitis of right upper limb: Secondary | ICD-10-CM | POA: Insufficient documentation

## 2023-03-18 MED ORDER — DOXYCYCLINE HYCLATE 100 MG PO TABS
100.0000 mg | ORAL_TABLET | Freq: Once | ORAL | Status: AC
  Administered 2023-03-18: 100 mg via ORAL
  Filled 2023-03-18: qty 1

## 2023-03-18 MED ORDER — OXYCODONE HCL 5 MG PO TABS
5.0000 mg | ORAL_TABLET | Freq: Four times a day (QID) | ORAL | 0 refills | Status: DC | PRN
  Filled 2023-03-18: qty 7, 2d supply, fill #0

## 2023-03-18 MED ORDER — OXYCODONE-ACETAMINOPHEN 5-325 MG PO TABS
1.0000 | ORAL_TABLET | Freq: Once | ORAL | Status: AC
  Administered 2023-03-18: 1 via ORAL
  Filled 2023-03-18: qty 1

## 2023-03-18 MED ORDER — CEPHALEXIN 250 MG PO CAPS
500.0000 mg | ORAL_CAPSULE | Freq: Once | ORAL | Status: AC
  Administered 2023-03-18: 500 mg via ORAL
  Filled 2023-03-18: qty 2

## 2023-03-18 MED ORDER — DOXYCYCLINE HYCLATE 100 MG PO CAPS
100.0000 mg | ORAL_CAPSULE | Freq: Two times a day (BID) | ORAL | 0 refills | Status: AC
  Filled 2023-03-18: qty 14, 7d supply, fill #0

## 2023-03-18 MED ORDER — CEPHALEXIN 500 MG PO CAPS
500.0000 mg | ORAL_CAPSULE | Freq: Four times a day (QID) | ORAL | 0 refills | Status: AC
  Filled 2023-03-18: qty 28, 7d supply, fill #0
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
## 2023-03-18 NOTE — ED Notes (Signed)
Pt discharged to home using teachback Method. Discharge instructions have been discussed with patient and/or family members. Pt verbally acknowledges understanding d/c instructions, has been given opportunity for questions to be answered, and endorses comprehension to checkout at registration before leaving.  

## 2023-03-18 NOTE — Discharge Instructions (Signed)
You were seen in the emergency department for your hand pain.  Your x-ray showed no broken bones but you do have an infection of your hand from where you cut your finger.  You can continue to do warm soaks to help get the drainage out of your wound and you can use ice to help with the swelling.  I have given you antibiotics and you should complete both of these as prescribed.  You can take Tylenol or Motrin every 6 hours as needed for pain and I have given you some oxycodone's for breakthrough pain.  This can make you drowsy so do not take before driving, working or operating heavy machinery.  You should follow-up with hand surgery to have your wound rechecked to make sure that it is healing appropriately.  You should return to the emergency department if you are having streaking redness up your arm, you are unable to bend your finger or your entire finger is swollen, you have fevers despite the antibiotics or if you have any other new or concerning symptoms.

## 2023-08-15 DIAGNOSIS — M79662 Pain in left lower leg: Secondary | ICD-10-CM | POA: Diagnosis not present

## 2023-08-15 DIAGNOSIS — M79605 Pain in left leg: Secondary | ICD-10-CM | POA: Diagnosis not present

## 2023-08-15 DIAGNOSIS — R269 Unspecified abnormalities of gait and mobility: Secondary | ICD-10-CM | POA: Diagnosis not present

## 2023-08-15 DIAGNOSIS — M25562 Pain in left knee: Secondary | ICD-10-CM | POA: Diagnosis not present

## 2023-08-15 DIAGNOSIS — F17210 Nicotine dependence, cigarettes, uncomplicated: Secondary | ICD-10-CM | POA: Diagnosis not present

## 2023-08-22 ENCOUNTER — Other Ambulatory Visit: Payer: Self-pay

## 2023-08-22 ENCOUNTER — Other Ambulatory Visit (HOSPITAL_BASED_OUTPATIENT_CLINIC_OR_DEPARTMENT_OTHER): Payer: Self-pay

## 2023-08-22 ENCOUNTER — Emergency Department (HOSPITAL_BASED_OUTPATIENT_CLINIC_OR_DEPARTMENT_OTHER)
Admission: EM | Admit: 2023-08-22 | Discharge: 2023-08-22 | Disposition: A | Discharge status: Home / Self Care | Payer: Self-pay | Attending: Emergency Medicine | Admitting: Emergency Medicine

## 2023-08-22 ENCOUNTER — Encounter (HOSPITAL_BASED_OUTPATIENT_CLINIC_OR_DEPARTMENT_OTHER): Payer: Self-pay | Admitting: Emergency Medicine

## 2023-08-22 DIAGNOSIS — M5432 Sciatica, left side: Secondary | ICD-10-CM | POA: Insufficient documentation

## 2023-08-22 DIAGNOSIS — R269 Unspecified abnormalities of gait and mobility: Secondary | ICD-10-CM | POA: Insufficient documentation

## 2023-08-22 MED ORDER — LIDOCAINE 5 % EX PTCH
2.0000 | MEDICATED_PATCH | CUTANEOUS | Status: DC
  Administered 2023-08-22: 2 via TRANSDERMAL
  Filled 2023-08-22: qty 2

## 2023-08-22 MED ORDER — HYDROCODONE-ACETAMINOPHEN 5-325 MG PO TABS
1.0000 | ORAL_TABLET | Freq: Once | ORAL | Status: AC
  Administered 2023-08-22: 1 via ORAL
  Filled 2023-08-22: qty 1

## 2023-08-22 MED ORDER — PREDNISONE 10 MG PO TABS
ORAL_TABLET | Freq: Every day | ORAL | 0 refills | Status: DC
  Filled 2023-08-22: qty 42, 6d supply, fill #0

## 2023-08-22 NOTE — Discharge Instructions (Addendum)
Thank you for letting us evaluate you today.  Will provide you with Norco and lidocaine in the ED for your pain.  I have sent a steroid pack to your pharmacy in [facility].  Please take this as directed on pack.  It will provide anti-inflammatory properties and calm everything down.  You may take your naproxen as well as prescribed.  I have provided you with a Ortho reference in [place] as you need to make an appointment for further management  Please return to emergency department if you experience inability to void, loss of sensation in genital region, pee or poop on yourself, pale or red lower extremity to indicate poor circulation or infection respectively

## 2023-08-22 NOTE — ED Triage Notes (Signed)
C/o left knee pain that rads upwards into left hip and left lower back. Reports pain has been increasingly worse over the last month. Denies any injuries. Been taking naproxen but reports no relief.

## 2023-08-22 NOTE — ED Provider Notes (Signed)
[place] EMERGENCY DEPARTMENT AT [facility] Provider Note   CSN: [id number] Arrival date & time: 08/22/23  0846     History  Chief Complaint  Patient presents with   Knee Pain    Oscar Anderson is a 37 y.o. male with no known significant past medical history presents to emergency department for evaluation of left leg pain that has been occurring over the past month.  He reports that a month ago he was "locking" for the first time and started feeling symptoms.  He describes pain as "sharp and burning from back of his buttock down the back of his left leg".  He endorses marijuana and EtOH use but reports that he has not been using marijuana recently.  He denies urinary symptoms, saddle paresthesia, previous malignancy, IVDU, change in sensation of LLE, fever.  He was seen at [facility] ED on 08/15/2023 for similar symptoms with negative DVT study and negative left knee x-ray.  He was provided naproxen but he reports this is not helping in pain.  He was recommended to have Ortho follow-up in [place] but he reports this is too far away from his home in [place].   Knee Pain Associated symptoms: no fatigue and no fever       Home Medications Prior to Admission medications   Medication Sig Start Date End Date Taking? Authorizing Provider  predniSONE (STERAPRED UNI-PAK 21 TAB) 10 MG (21) TBPK tablet Take by mouth daily. Take 6 tabs by mouth daily  for 2 days, then 5 tabs for 2 days, then 4 tabs for 2 days, then 3 tabs for 2 days, 2 tabs for 2 days, then 1 tab by mouth daily for 2 days 08/22/23  Yes Judithann Sheen, PA  HYDROcodone-acetaminophen (NORCO/VICODIN) 5-325 MG tablet Take 2 tablets by mouth every 6 (six) hours as needed. 06/11/21   Prosperi, Christian H, PA-C  HYDROcodone-acetaminophen (NORCO/VICODIN) 5-325 MG tablet Take 1 tablet by mouth every 4 (four) hours as needed for moderate pain. 10/06/22 10/06/23  Crista Elliot, MD  oxyCODONE  (ROXICODONE) 5 MG immediate release tablet Take 1 tablet (5 mg total) by mouth every 6 (six) hours as needed for severe pain. 03/18/23   Elayne Snare K, DO  sulfamethoxazole-trimethoprim (BACTRIM DS) 800-160 MG tablet Take 1 tablet by mouth 2 (two) times daily. 10/06/22   Crista Elliot, MD      Allergies    Lamictal [lamotrigine], Flexeril [cyclobenzaprine], Lamictal [lamotrigine], Toradol [ketorolac tromethamine], Toradol [ketorolac tromethamine], Tramadol hcl, and Tramadol    Review of Systems   Review of Systems  Constitutional:  Negative for chills, fatigue and fever.  Respiratory:  Negative for cough, chest tightness, shortness of breath and wheezing.   Cardiovascular:  Negative for chest pain and palpitations.  Gastrointestinal:  Negative for abdominal pain, constipation, diarrhea, nausea and vomiting.  Musculoskeletal:  Positive for arthralgias.  Neurological:  Negative for dizziness, seizures, weakness, light-headedness, numbness and headaches.    Physical Exam Updated Vital Signs BP (!) 158/88 (BP Location: Left Arm)   Pulse 81   Temp 98 F (36.7 C) (Oral)   Resp 18   Ht 5\' 8"  (1.727 m)   Wt 78.5 kg   SpO2 98%   BMI 26.30 kg/m  Physical Exam Vitals and nursing note reviewed.  Constitutional:      General: He is not in acute distress.    Appearance: Normal appearance.  HENT:     Head: Normocephalic and atraumatic.  Eyes:  Conjunctiva/sclera: Conjunctivae normal.  Cardiovascular:     Rate and Rhythm: Normal rate.     Comments: Dorsalis pedis 2+ equal bilaterally Pulmonary:     Effort: Pulmonary effort is normal. No respiratory distress.  Musculoskeletal:        General: No swelling or deformity.     Cervical back: Normal range of motion. No tenderness.     Right lower leg: No edema.     Left lower leg: No edema.     Comments: TTP with deep palpation of popliteal fossa and lower lumbar midspinous processes No palpable mass to spine No swelling, TTP,  nor decreased ROM of left hip, left knee, left ankle  Skin:    General: Skin is warm.     Capillary Refill: Capillary refill takes less than 2 seconds.     Coloration: Skin is not jaundiced or pale.     Findings: No erythema.  Neurological:     Mental Status: He is alert and oriented to person, place, and time. Mental status is at baseline.     GCS: GCS eye subscore is 4. GCS verbal subscore is 5. GCS motor subscore is 6.     Cranial Nerves: No cranial nerve deficit.     Sensory: No sensory deficit.     Motor: No weakness.     Coordination: Coordination normal. Heel to Shin Test normal.     Gait: Gait abnormal (Mild limp 2/2 pain).     Deep Tendon Reflexes: Reflexes normal. Babinski sign absent on the right side. Babinski sign absent on the left side.     Reflex Scores:      Patellar reflexes are 2+ on the right side and 2+ on the left side.      Achilles reflexes are 2+ on the right side and 2+ on the left side.    Comments: Sensation intact of L4-S2 of BLE +SLR of LLE Motor of LEs equal and intact bilaterally    ED Results / Procedures / Treatments   Labs (all labs ordered are listed, but only abnormal results are displayed) Labs Reviewed - No data to display  EKG None  Radiology No results found.  Procedures Procedures    Medications Ordered in ED Medications  lidocaine (LIDODERM) 5 % 2 patch (2 patches Transdermal Patch Applied 08/22/23 0957)  HYDROcodone-acetaminophen (NORCO/VICODIN) 5-325 MG per tablet 1 tablet (1 tablet Oral Given 08/22/23 0957)    ED Course/ Medical Decision Making/ A&P                                 Medical Decision Making Risk Prescription drug management.   Patient presents to the ED for concern of LLE pain, this involves an extensive number of treatment options, and is a complaint that carries with it a high risk of complications and morbidity.  The differential diagnosis includes injury, cellulitis, septic joint, DVT, sciatica.  This  is not an exhaustive list   Co morbidities that complicate the patient evaluation  None   Additional history obtained:  Additional history obtained from  Nursing and Outside Medical Records   External records from outside source obtained and reviewed including triage RN note   Medicines ordered and prescription drug management:  I ordered medication including Norco, lido, steroids  for likely sciatica  Reevaluation of the patient after these medicines showed that the patient improved I have reviewed the patients home medicines and have made adjustments  as needed   Test Considered:  I considered imaging however he had imaging on 07/15/2023 that was negative with similar symptoms.  I have low suspicion for septic joint, cellulitis due to reassuring physical exam    Problem List / ED Course:  Sciatica Will provide analgesia and lidocaine patch for pain in ED Will provide prescription for steroid pack as symptoms are persisting despite naproxen Discussed importance of stretching and Ortho follow-up   Reevaluation:  After the interventions noted above, I reevaluated the patient and found that they have :improved   Social Determinants of Health:  Does not have PCP Currently living in his vehicle   Dispostion:  After consideration of the diagnostic results and the patients response to treatment, I feel that the patent would benefit from outpatient management with steroid taper.  Symptoms are likely due to sciatica.  I provided him with Ortho follow-up as he reports he was unable to follow-up with previous provider as reference was in [place] not close to his home.  I also provided him with sciatica information and stretches.  Discussed return to emergency department precautions to patient expresses understanding agrees with plan.  All questions answered to his satisfaction.        Final Clinical Impression(s) / ED Diagnoses Final diagnoses:  Sciatica of left  side    Rx / DC Orders ED Discharge Orders          Ordered    predniSONE (STERAPRED UNI-PAK 21 TAB) 10 MG (21) TBPK tablet  Daily        08/22/23 1001              Judithann Sheen, [place] 08/22/23 1007    Alvira Monday, MD 08/22/23 2219

## 2023-08-23 ENCOUNTER — Telehealth: Payer: Self-pay

## 2023-08-23 NOTE — Transitions of Care (Post Inpatient/ED Visit) (Unsigned)
   08/23/2023  Name: Oscar Anderson MRN: [id number] DOB: Sep 04, 1986  Today's TOC FU Call Status: Today's TOC FU Call Status:: Unsuccessful Call (1st Attempt) Unsuccessful Call (1st Attempt) Date: 08/23/23  Attempted to reach the patient regarding the most recent Inpatient/ED visit.  Follow Up Plan: Additional outreach attempts will be made to reach the patient to complete the Transitions of Care (Post Inpatient/ED visit) call.    Vilda Zollner, CMA  [facility] AW[facility] Team Direct Dial: [phone]

## 2023-08-25 NOTE — Telephone Encounter (Signed)
Transition Care Management Unsuccessful Follow-up Telephone Call  Date of discharge and from where:  08-22-2023  Attempts:  2nd Attempt  Reason for unsuccessful TCM follow-up call:  Unable to reach patient

## 2023-09-01 ENCOUNTER — Other Ambulatory Visit (HOSPITAL_BASED_OUTPATIENT_CLINIC_OR_DEPARTMENT_OTHER): Payer: Self-pay

## 2023-09-16 ENCOUNTER — Ambulatory Visit: Payer: Self-pay

## 2023-09-16 NOTE — Telephone Encounter (Signed)
  Chief Complaint: L leg pain Symptoms: pain with ambulation and rest Frequency: constant since beginning of november Pertinent Negatives: Patient denies numbness or tingling in leg or foot, redness, warmth Disposition: [] ED /[] Urgent Care (no appt availability in office) / [x] Appointment(In office/virtual)/ []  [facility]/ [] Home Care/ [] Refused Recommended Disposition /[] [facility]/ []  Follow-up with PCP Additional Notes: Pt calls stating that he has had pain that starts on his L hip and radiates down his entire leg since the beginning of November. Pt reports that he was dragging a tree at work and felt a pop in his leg and that is when the pain began. Pt states he has been evaluated in ER 2 times with a diagnosis of sciatica. Pt denies bruising, redness, swelling, numbness or tingling in L leg and foot. States pain increases and decreases with change of position. Reports pain is 7/10. States he has some mild back pain as well but attributes it to having to sit in different positions to relieve pain. Per protocol pt to be evaluated within 3 days. Pt scheduled for 09/19/23 at 1040 at [facility] per pt request.    Copied from CRM [id number]. Topic: Appointments - Appointment Scheduling >> Sep 16, 2023  2:36 PM Louie Casa B wrote: Patient/patient representative is calling to schedule an appointment.  Pt has a lot of pain in left knee and swelling Refer to attachments for appointment information. Reason for Disposition  [1] MODERATE pain (e.g., interferes with normal activities, limping) AND [2] present > 3 days  Answer Assessment - Initial Assessment Questions 1. ONSET: "When did the pain start?"      Beginning of November, constant 2. LOCATION: "Where is the pain located?"      L hip all the way down left leg to calf 3. PAIN: "How bad is the pain?"    (Scale 1-10; or mild, moderate, severe)   -  MILD (1-3): doesn't interfere with normal activities    -  MODERATE (4-7):  interferes with normal activities (e.g., work or school) or awakens from sleep, limping    -  SEVERE (8-10): excruciating pain, unable to do any normal activities, unable to walk     7/10 4. WORK OR EXERCISE: "Has there been any recent work or exercise that involved this part of the body?"      Does manual labor for work 5. CAUSE: "What do you think is causing the leg pain?"     Was dragging a tree for work, at the beginning of November. 6. OTHER SYMPTOMS: "Do you have any other symptoms?" (e.g., chest pain, back pain, breathing difficulty, swelling, rash, fever, numbness, weakness)     Back pain from the position he has to sit to relieve pain.  Protocols used: Leg Pain-A-AH

## 2023-09-19 ENCOUNTER — Encounter: Payer: Self-pay | Admitting: Urgent Care

## 2024-05-14 ENCOUNTER — Encounter: Payer: Self-pay | Admitting: Urgent Care

## 2024-05-14 ENCOUNTER — Ambulatory Visit (INDEPENDENT_AMBULATORY_CARE_PROVIDER_SITE_OTHER): Payer: Self-pay | Admitting: Urgent Care

## 2024-05-14 VITALS — BP 134/94 | HR 85 | Resp 16 | Ht 68.0 in | Wt 168.5 lb

## 2024-05-14 DIAGNOSIS — M545 Low back pain, unspecified: Secondary | ICD-10-CM

## 2024-05-14 DIAGNOSIS — G8929 Other chronic pain: Secondary | ICD-10-CM

## 2024-05-14 DIAGNOSIS — M5416 Radiculopathy, lumbar region: Secondary | ICD-10-CM | POA: Diagnosis not present

## 2024-05-14 DIAGNOSIS — F331 Major depressive disorder, recurrent, moderate: Secondary | ICD-10-CM

## 2024-05-14 DIAGNOSIS — M899 Disorder of bone, unspecified: Secondary | ICD-10-CM

## 2024-05-14 DIAGNOSIS — F319 Bipolar disorder, unspecified: Secondary | ICD-10-CM

## 2024-05-14 DIAGNOSIS — N2889 Other specified disorders of kidney and ureter: Secondary | ICD-10-CM | POA: Diagnosis not present

## 2024-05-14 MED ORDER — LURASIDONE HCL 20 MG PO TABS: 20.0000 mg | ORAL_TABLET | Freq: Every day | ORAL | 1 refills | Status: DC

## 2024-05-14 MED ORDER — HYDROCODONE-ACETAMINOPHEN 5-325 MG PO TABS: 1.0000 | ORAL_TABLET | Freq: Four times a day (QID) | ORAL | 0 refills | Status: DC | PRN

## 2024-05-14 NOTE — Patient Instructions (Addendum)
 Start Latuda  once daily. This should help with depression and anxiety. I have placed a referral for therapy.  Take the norco prescribed today as needed for SEVERE pain. Do not take often as it can be addictive. It can also cause drowsiness and lead to constipation. Continue tylenol  and topical lidocaine  patch for less severe pain.  I have ordered a PET scan. Please complete this.  Keep your appointment with urology.  Please schedule a follow up in 4-6 weeks for medication review and follow up.
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
## 2024-05-15 ENCOUNTER — Ambulatory Visit: Payer: Self-pay | Admitting: Urgent Care

## 2024-05-15 LAB — CMP14+EGFR
ALT: 14 IU/L (ref 0–44)
AST: 18 IU/L (ref 0–40)
Albumin: 4.8 g/dL (ref 4.1–5.1)
Alkaline Phosphatase: 102 IU/L (ref 44–121)
BUN/Creatinine Ratio: 13 (ref 9–20)
BUN: 12 mg/dL (ref 6–20)
Bilirubin Total: 0.6 mg/dL (ref 0.0–1.2)
CO2: 20 mmol/L (ref 20–29)
Calcium: 9.4 mg/dL (ref 8.7–10.2)
Chloride: 100 mmol/L (ref 96–106)
Creatinine, Ser: 0.95 mg/dL (ref 0.76–1.27)
Globulin, Total: 2 g/dL (ref 1.5–4.5)
Glucose: 89 mg/dL (ref 70–99)
Potassium: 4.5 mmol/L (ref 3.5–5.2)
Sodium: 137 mmol/L (ref 134–144)
Total Protein: 6.8 g/dL (ref 6.0–8.5)
eGFR: 106 mL/min/1.73 (ref >59)

## 2024-05-15 LAB — CBC WITH DIFFERENTIAL/PLATELET
Basophils Absolute: 0.1 x10E3/uL (ref 0.0–0.2)
Basos: 1 %
EOS (ABSOLUTE): 0.1 x10E3/uL (ref 0.0–0.4)
Eos: 1 %
Hematocrit: 48.6 % (ref 37.5–51.0)
Hemoglobin: 16.1 g/dL (ref 13.0–17.7)
Immature Grans (Abs): 0 x10E3/uL (ref 0.0–0.1)
Immature Granulocytes: 0 %
Lymphocytes Absolute: 1.6 x10E3/uL (ref 0.7–3.1)
Lymphs: 13 %
MCH: 30.5 pg (ref 26.6–33.0)
MCHC: 33.1 g/dL (ref 31.5–35.7)
MCV: 92 fL (ref 79–97)
Monocytes Absolute: 1.1 x10E3/uL — ABNORMAL HIGH (ref 0.1–0.9)
Monocytes: 9 %
Neutrophils Absolute: 9.6 x10E3/uL — ABNORMAL HIGH (ref 1.4–7.0)
Neutrophils: 76 %
Platelets: 284 x10E3/uL (ref 150–450)
RBC: 5.28 x10E6/uL (ref 4.14–5.80)
RDW: 12.2 % (ref 11.6–15.4)
WBC: 12.4 x10E3/uL — ABNORMAL HIGH (ref 3.4–10.8)

## 2024-05-20 ENCOUNTER — Encounter: Payer: Self-pay | Admitting: Urgent Care

## 2024-05-20 DIAGNOSIS — N2889 Other specified disorders of kidney and ureter: Secondary | ICD-10-CM

## 2024-05-20 DIAGNOSIS — M545 Low back pain, unspecified: Secondary | ICD-10-CM

## 2024-05-20 DIAGNOSIS — M5416 Radiculopathy, lumbar region: Secondary | ICD-10-CM

## 2024-05-25 MED ORDER — HYDROCODONE-ACETAMINOPHEN 10-325 MG PO TABS: 1.0000 | ORAL_TABLET | Freq: Three times a day (TID) | ORAL | 0 refills | Status: DC | PRN

## 2024-05-28 ENCOUNTER — Encounter (HOSPITAL_COMMUNITY)

## 2024-06-12 ENCOUNTER — Ambulatory Visit (HOSPITAL_COMMUNITY): Payer: Self-pay | Admitting: Licensed Clinical Social Worker

## 2024-06-12 ENCOUNTER — Encounter: Payer: Self-pay | Admitting: Urgent Care

## 2024-06-12 ENCOUNTER — Ambulatory Visit (INDEPENDENT_AMBULATORY_CARE_PROVIDER_SITE_OTHER): Admitting: Urgent Care

## 2024-06-12 ENCOUNTER — Telehealth: Payer: Self-pay

## 2024-06-12 ENCOUNTER — Other Ambulatory Visit: Payer: Self-pay | Admitting: Urgent Care

## 2024-06-12 VITALS — BP 127/76 | HR 71 | Ht 68.0 in | Wt 170.0 lb

## 2024-06-12 DIAGNOSIS — F319 Bipolar disorder, unspecified: Secondary | ICD-10-CM | POA: Diagnosis not present

## 2024-06-12 DIAGNOSIS — R4589 Other symptoms and signs involving emotional state: Secondary | ICD-10-CM

## 2024-06-12 DIAGNOSIS — R3589 Other polyuria: Secondary | ICD-10-CM | POA: Diagnosis not present

## 2024-06-12 DIAGNOSIS — C642 Malignant neoplasm of left kidney, except renal pelvis: Secondary | ICD-10-CM

## 2024-06-12 DIAGNOSIS — M899 Disorder of bone, unspecified: Secondary | ICD-10-CM

## 2024-06-12 DIAGNOSIS — N2889 Other specified disorders of kidney and ureter: Secondary | ICD-10-CM

## 2024-06-12 DIAGNOSIS — F331 Major depressive disorder, recurrent, moderate: Secondary | ICD-10-CM

## 2024-06-12 MED ORDER — HYDROXYZINE PAMOATE 25 MG PO CAPS: 25.0000 mg | ORAL_CAPSULE | Freq: Three times a day (TID) | ORAL | 0 refills | Status: AC | PRN

## 2024-06-12 MED ORDER — LURASIDONE HCL 40 MG PO TABS: 40.0000 mg | ORAL_TABLET | Freq: Every day | ORAL | 3 refills | Status: AC

## 2024-06-12 MED ORDER — HYDROCODONE-ACETAMINOPHEN 10-325 MG PO TABS: 1.0000 | ORAL_TABLET | Freq: Three times a day (TID) | ORAL | 0 refills | Status: DC | PRN

## 2024-06-12 NOTE — Telephone Encounter (Unsigned)
 Copied from CRM [id number]. Topic: Clinical - Prescription Issue >> Jun 12, 2024  3:56 PM Hamdi H wrote: Patient's wife Bretta called back again stating she still hasn't heard back about this issue. [phone].

## 2024-06-12 NOTE — Telephone Encounter (Signed)
 Copied from CRM [id number]. Topic: Clinical - Prescription Issue >> Jun 12, 2024  1:41 PM Alfonso ORN wrote: Reason for CRM: lynsey (spouse)calling on behalf of pt. The HYDROcodone -acetaminophen  (NORCO) 10-325 MG tablet [facility] in error , was discussed today in the office that Crain,Whitney L , Pa would be writing a prescription for the percocet  10 mg instead ,  Went to the pharmacy and they have the HYDROcodone -acetaminophen  (NORCO) 10-325 MG tablet which is not what was discussed in the office today with provider    Patient request a call back regarding this matter    ----------------------------------------------------------------------- From previous Reason for Contact - Scheduling: Patient/patient representative is calling to schedule an appointment. Refer to attachments for appointment information.

## 2024-06-12 NOTE — Progress Notes (Unsigned)
 Established Patient Office Visit  Subjective:  Patient ID: Oscar Anderson, male    DOB: 12-04-1985  Age: 38 y.o. MRN: [id number]  Chief Complaint  Patient presents with   Hospitalization Follow-up    HPI  Discussed the use of AI scribe software for clinical note transcription with the patient, who gave verbal consent to proceed.  History of Present Illness   Oscar Anderson is a 38 year old male who presents with a new mass on the kidney and associated symptoms.  He underwent a biopsy/ partial L nephrectomy on 05/31/24 and was discharged two days later. Shortly after, he returned to the ER due to new onset left-sided pain. A CT scan revealed a new mass on the lower pole of the same kidney. An ultrasound and CT scan were performed.  He was discharged with antibiotics for a CT-suggested pneumonia and experienced shortness of breath, which prompted further imaging. He was treated with Augmentin  and albuterol, and his shortness of breath has since improved.  He reports left-sided pain near the incision site, described as pressure and pulling, which worsens with certain movements. He was prescribed hydrocodone  5mg  for pain management, which provides some relief.  His pathology results from the biopsy are pending, with initial findings showing benign tissue from a sliver of the kidney. The results have been sent for further interpretation.  He reports increased urination without pain, despite not drinking much water, and describes his urine as dark. He is currently on Augmentin , Latuda , and pain medication, and notes that Latuda  helps with irritability. He is requesting an increase in Latuda  as he has noted improvement, but feels dose is not strong enough. No ADRs. He is also requesting something for intermittent anxiety/ panic.  Family history is significant for kidney problems and cancer. He acknowledges a high sugar intake and smoking, which he is attempting to reduce.  No  urinary pain.       Patient Active Problem List   Diagnosis Date Noted   Lumbar radiculopathy 08/10/2016   Lumbago 08/09/2016   Bipolar I disorder (HCC) 02/18/2016   Pilonidal cyst 01/21/2016   Past Medical History:  Diagnosis Date   Asthma    Chronic back pain    Irregular heartbeat    as a child   Restless leg syndrome    Past Surgical History:  Procedure Laterality Date   KNEE SURGERY Right    ORCHIECTOMY Left 10/06/2022   Procedure: LEFT ORCHIECTOMY;  Surgeon: Carolee Sherwood JONETTA DOUGLAS, MD;  Location: [facility] OR;  Service: Urology;  Laterality: Left;   polycystic kidney disease     SCROTAL EXPLORATION N/A 10/06/2022   Procedure: SCROTUM EXPLORATION;  Surgeon: Carolee Sherwood JONETTA DOUGLAS, MD;  Location: [facility] OR;  Service: Urology;  Laterality: N/A;   Thumb surgery Left    Social History   Tobacco Use   Smoking status: Every Day    Current packs/day: 1.00    Types: Cigarettes   Smokeless tobacco: Never  Vaping Use   Vaping status: Never Used  Substance Use Topics   Alcohol use: Yes    Comment: daily   Drug use: Yes    Types: Marijuana    Comment: an hour ago      ROS: as noted in HPI  Objective:     BP 127/76   Pulse 71   Ht 5' 8 (1.727 m)   Wt 170 lb (77.1 kg)   SpO2 98%   BMI 25.85 kg/m  BP Readings from Last  3 Encounters:  06/12/24 127/76  05/14/24 (!) 134/94  08/22/23 (!) 154/80   Wt Readings from Last 3 Encounters:  06/12/24 170 lb (77.1 kg)  05/14/24 168 lb 8 oz (76.4 kg)  08/22/23 173 lb (78.5 kg)      Physical Exam Vitals and nursing note reviewed. Exam conducted with a chaperone present.  Constitutional:      General: He is not in acute distress.    Appearance: Normal appearance. He is normal weight. He is not ill-appearing, toxic-appearing or diaphoretic.  HENT:     Head: Normocephalic and atraumatic.     Mouth/Throat:     Mouth: Mucous membranes are moist.  Eyes:     General: No scleral icterus.       Right eye: No discharge.        Left eye: No  discharge.  Cardiovascular:     Rate and Rhythm: Normal rate and regular rhythm.  Pulmonary:     Effort: Pulmonary effort is normal. No respiratory distress.     Breath sounds: Normal breath sounds. No wheezing or rhonchi.  Chest:     Chest wall: Tenderness present.  Abdominal:     General: Abdomen is flat.     Palpations: Abdomen is soft.     Tenderness: There is abdominal tenderness (LUQ/ flank radiating to back). There is left CVA tenderness.  Musculoskeletal:     Cervical back: Normal range of motion. No rigidity.  Lymphadenopathy:     Cervical: No cervical adenopathy.  Skin:    General: Skin is warm and dry.     Findings: No bruising or rash.  Neurological:     General: No focal deficit present.     Mental Status: He is alert and oriented to person, place, and time.    Results for orders placed or performed in visit on 06/12/24  POCT URINALYSIS DIP (CLINITEK)  Result Value Ref Range   Color, UA yellow yellow   Clarity, UA clear clear   Glucose, UA negative negative mg/dL   Bilirubin, UA negative negative   Ketones, POC UA negative negative mg/dL   Spec Grav, UA 8.974 8.989 - 1.025   Blood, UA negative negative   pH, UA 6.0 5.0 - 8.0   POC PROTEIN,UA negative negative, trace   Urobilinogen, UA 0.2 0.2 or 1.0 E.U./dL   Nitrite, UA Negative Negative   Leukocytes, UA Negative Negative    Last CBC Lab Results  Component Value Date   WBC 12.4 (H) 05/14/2024   HGB 16.1 05/14/2024   HCT 48.6 05/14/2024   MCV 92 05/14/2024   MCH 30.5 05/14/2024   RDW 12.2 05/14/2024   PLT 284 05/14/2024   Last metabolic panel Lab Results  Component Value Date   GLUCOSE 89 05/14/2024   NA 137 05/14/2024   K 4.5 05/14/2024   CL 100 05/14/2024   CO2 20 05/14/2024   BUN 12 05/14/2024   CREATININE 0.95 05/14/2024   EGFR 106 05/14/2024   CALCIUM 9.4 05/14/2024   PROT 6.8 05/14/2024   ALBUMIN 4.8 05/14/2024   LABGLOB 2.0 05/14/2024   BILITOT 0.6 05/14/2024   ALKPHOS 102  05/14/2024   AST 18 05/14/2024   ALT 14 05/14/2024   ANIONGAP 10 10/26/2022    The ASCVD Risk score (Arnett DK, et al., 2019) failed to calculate for the following reasons:   The 2019 ASCVD risk score is only valid for ages 53 to 44  Assessment & Plan:  Left renal mass -  HYDROcodone -Acetaminophen ; Take 1 tablet by mouth every 8 (eight) hours as needed for severe pain (pain score 7-10).  Dispense: 15 tablet; Refill: 0 -     MR ABDOMEN W WO CONTRAST; Future  Polyuria -     POCT URINALYSIS DIP (CLINITEK)  Bipolar I disorder (HCC) -     Lurasidone  HCl; Take 1 tablet (40 mg total) by mouth daily with breakfast.  Dispense: 30 tablet; Refill: 3  Moderate episode of recurrent major depressive disorder (HCC) -     Lurasidone  HCl; Take 1 tablet (40 mg total) by mouth daily with breakfast.  Dispense: 30 tablet; Refill: 3  Anxiety about health -     Lurasidone  HCl; Take 1 tablet (40 mg total) by mouth daily with breakfast.  Dispense: 30 tablet; Refill: 3 -     hydrOXYzine  Pamoate; Take 1 capsule (25 mg total) by mouth every 8 (eight) hours as needed for anxiety.  Dispense: 30 capsule; Refill: 0  Rib lesion -     NM Bone Scan Limited; Future  Other specified disorders of kidney and ureter -     MR ABDOMEN W WO CONTRAST; Future  Assessment and Plan    Left renal mass, possible malignancy New mass on the lower pole of the left kidney with rapid growth, suggesting possible infection. Biopsy results pending. - Order MRI of the kidney with and without contrast as stat. - Follow up on biopsy results.  Post-procedural pain (left flank and chest) Pain likely related to recent procedure and possible complications from renal mass. Anti-inflammatory medications not recommended. - Increase pain medication from 5 mg to 10 mg (hydrocodone  10).  Anemia, postoperative Postoperative anemia with hemoglobin levels improved from 12 to 12.9, indicating recovery.  Pneumonia and left pleural effusion,  resolving Pneumonia and left pleural effusion identified on CT. Symptoms of shortness of breath resolved, indicating improvement. VSS  Tobacco use disorder Continued tobacco use, though reduced. Smoking is a significant risk factor for kidney cancer. - Advise cessation of smoking to reduce risk of kidney cancer.  Depression and anxiety symptoms Depression and anxiety symptoms present. Current medication, Latuda , is helping but may need an increased dose. Anxiety significant in crowded situations. - Increase Latuda  dosage to 40mg . - Consider additional medication for anxiety - trial of atarax  prn.  Rib lesion Again noted on CT from ER. Will obtain nuc med bone scan. Previous PET scan was ordered and denied by insurance.  Polyuria No UTI sx. UA in office clear.          No follow-ups on file.   Benton LITTIE Gave, PA

## 2024-06-12 NOTE — Patient Instructions (Signed)
 Take the pain medication as needed - up to every 8 hours.  I will order further imaging to address the new findings from the ER.  Stop 20mg  latuda  and increase to 40mg  daily. Take atarax  as needed for anxiety, up to every 8 hours.

## 2024-06-13 ENCOUNTER — Other Ambulatory Visit: Payer: Self-pay | Admitting: Urgent Care

## 2024-06-13 LAB — POCT URINALYSIS DIP (CLINITEK)
Bilirubin, UA: NEGATIVE
Blood, UA: NEGATIVE
Glucose, UA: NEGATIVE mg/dL
Ketones, POC UA: NEGATIVE mg/dL
Leukocytes, UA: NEGATIVE
Nitrite, UA: NEGATIVE
POC PROTEIN,UA: NEGATIVE
Spec Grav, UA: 1.025 (ref 1.010–1.025)
Urobilinogen, UA: 0.2 U/dL
pH, UA: 6 (ref 5.0–8.0)

## 2024-06-14 ENCOUNTER — Ambulatory Visit: Admitting: Urgent Care

## 2024-06-14 ENCOUNTER — Encounter: Payer: Self-pay | Admitting: *Deleted

## 2024-06-14 NOTE — Progress Notes (Signed)
 Reached out to Lonni CHRISTELLA Kay to introduce myself as the office RN Navigator and explain our new patient process. Reviewed the reason for their referral and scheduled their new patient appointment along with labs. Provided address and directions to the office including call back phone number. Reviewed with patient any concerns they may have or any possible barriers to attending their appointment.   Informed patient about my role as a navigator and that I will meet with them prior to their New Patient appointment and more fully discuss what services I can provide. At this time patient has no further questions or needs.    Oncology Nurse Navigator Documentation     06/14/2024    8:30 AM  Oncology Nurse Navigator Flowsheets  Abnormal Finding Date 04/05/2024  Confirmed Diagnosis Date 05/31/2024  Diagnosis Status Confirmed Diagnosis Complete  Phase of Treatment Surgery  Surgery Actual Start Date: 05/31/2024  Navigator Follow Up Date: 06/19/2024  Navigator Follow Up Reason: New Patient Appointment  Navigator Location [facility]  Referral Date to RadOnc/MedOnc 06/13/2024  Navigator Encounter Type Introductory Phone Call  Treatment Initiated Date 05/31/2024  Patient Visit Type MedOnc  Treatment Phase Active Tx  Barriers/Navigation Needs Coordination of Care;Education  Education Other  Interventions Coordination of Care;Education  Acuity Level 2-Minimal Needs (1-2 Barriers Identified)  Coordination of Care Appts  Education Method Verbal  Support Groups/Services Friends and Family  Time Spent with Patient 30

## 2024-06-15 ENCOUNTER — Ambulatory Visit: Payer: Self-pay | Admitting: Urgent Care

## 2024-06-15 ENCOUNTER — Ambulatory Visit (HOSPITAL_BASED_OUTPATIENT_CLINIC_OR_DEPARTMENT_OTHER)
Admission: RE | Admit: 2024-06-15 | Discharge: 2024-06-15 | Disposition: A | Discharge status: Home / Self Care | Source: Ambulatory Visit | Attending: Urgent Care | Admitting: Urgent Care

## 2024-06-15 DIAGNOSIS — N2889 Other specified disorders of kidney and ureter: Secondary | ICD-10-CM | POA: Insufficient documentation

## 2024-06-15 DIAGNOSIS — C642 Malignant neoplasm of left kidney, except renal pelvis: Secondary | ICD-10-CM

## 2024-06-15 DIAGNOSIS — M5416 Radiculopathy, lumbar region: Secondary | ICD-10-CM

## 2024-06-15 MED ORDER — GADOBUTROL 1 MMOL/ML IV SOLN
7.5000 mL | Freq: Once | INTRAVENOUS | Status: AC | PRN
Start: 2024-06-15 — End: 2024-06-15
  Administered 2024-06-15: 7.5 mL via INTRAVENOUS
  Filled 2024-06-15: qty 7.5

## 2024-06-18 ENCOUNTER — Encounter: Payer: Self-pay | Admitting: Urgent Care

## 2024-06-18 ENCOUNTER — Encounter (HOSPITAL_COMMUNITY)
Admission: RE | Admit: 2024-06-18 | Discharge: 2024-06-18 | Disposition: A | Discharge status: Home / Self Care | Source: Ambulatory Visit | Attending: Urgent Care | Admitting: Urgent Care

## 2024-06-18 ENCOUNTER — Other Ambulatory Visit: Payer: Self-pay | Admitting: Urgent Care

## 2024-06-18 ENCOUNTER — Telehealth: Payer: Self-pay | Admitting: Urgent Care

## 2024-06-18 DIAGNOSIS — M899 Disorder of bone, unspecified: Secondary | ICD-10-CM | POA: Diagnosis present

## 2024-06-18 DIAGNOSIS — N2889 Other specified disorders of kidney and ureter: Secondary | ICD-10-CM

## 2024-06-18 MED ORDER — TECHNETIUM TC 99M MEDRONATE IV KIT
20.0000 | PACK | Freq: Once | INTRAVENOUS | Status: AC | PRN
  Administered 2024-06-18: 20.8 via INTRAVENOUS

## 2024-06-18 MED ORDER — HYDROCODONE-ACETAMINOPHEN 10-325 MG PO TABS: 1.0000 | ORAL_TABLET | Freq: Three times a day (TID) | ORAL | 0 refills | Status: DC | PRN

## 2024-06-18 NOTE — Telephone Encounter (Signed)
 Copied from CRM [id number]. Topic: Clinical - Medication Refill >> Jun 18, 2024 11:37 AM Sophia H wrote: Medication: HYDROcodone -acetaminophen  (NORCO) 10-325 MG tablet   Has the patient contacted their pharmacy? Yes, pharmacy said to reach out to office.   This is the patient's preferred pharmacy:  [id number] [place], [place] - [address] [place] [address] Phone: [phone] Fax: [phone]  Is this the correct pharmacy for this prescription? Yes If no, delete pharmacy and type the correct one.   Has the prescription been filled recently? Yes  Is the patient out of the medication? Yes  Has the patient been seen for an appointment in the last year OR does the patient have an upcoming appointment? Yes, seen 09/23.  Can we respond through MyChart? Yes  Agent: Please be advised that Rx refills may take up to 3 business days. We ask that you follow-up with your pharmacy.

## 2024-06-18 NOTE — Telephone Encounter (Unsigned)
 Copied from CRM #[id number]. Topic: Clinical - Medication Refill >> Jun 18, 2024  2:19 PM Amy B wrote: Medication: HYDROcodone -acetaminophen  (NORCO) 10-325 MG tablet  Has the patient contacted their pharmacy? Yes (Agent: If no, request that the patient contact the pharmacy for the refill. If patient does not wish to contact the pharmacy document the reason why and proceed with request.) (Agent: If yes, when and what did the pharmacy advise?)  This is the patient's preferred pharmacy:  [id number] - [place], [place] - [address] [place] [address] Phone: [phone] Fax: [phone]  Is this the correct pharmacy for this prescription? Yes If no, delete pharmacy and type the correct one.   Has the prescription been filled recently? No  Is the patient out of the medication? Yes  Has the patient been seen for an appointment in the last year OR does the patient have an upcoming appointment? Yes  Can we respond through MyChart? Yes  Agent: Please be advised that Rx refills may take up to 3 business days. We ask that you follow-up with your pharmacy.

## 2024-06-19 ENCOUNTER — Other Ambulatory Visit: Payer: Self-pay

## 2024-06-19 ENCOUNTER — Encounter: Payer: Self-pay | Admitting: *Deleted

## 2024-06-19 ENCOUNTER — Inpatient Hospital Stay: Attending: Hematology & Oncology

## 2024-06-19 ENCOUNTER — Inpatient Hospital Stay: Admitting: Hematology & Oncology

## 2024-06-19 ENCOUNTER — Encounter: Payer: Self-pay | Admitting: Hematology & Oncology

## 2024-06-19 VITALS — BP 127/77 | HR 80 | Temp 98.2°F | Resp 18 | Ht 68.0 in | Wt 175.0 lb

## 2024-06-19 DIAGNOSIS — Q613 Polycystic kidney, unspecified: Secondary | ICD-10-CM | POA: Insufficient documentation

## 2024-06-19 DIAGNOSIS — F10129 Alcohol abuse with intoxication, unspecified: Secondary | ICD-10-CM | POA: Diagnosis not present

## 2024-06-19 DIAGNOSIS — C642 Malignant neoplasm of left kidney, except renal pelvis: Secondary | ICD-10-CM | POA: Insufficient documentation

## 2024-06-19 DIAGNOSIS — F1290 Cannabis use, unspecified, uncomplicated: Secondary | ICD-10-CM

## 2024-06-19 DIAGNOSIS — Z905 Acquired absence of kidney: Secondary | ICD-10-CM | POA: Insufficient documentation

## 2024-06-19 DIAGNOSIS — F17210 Nicotine dependence, cigarettes, uncomplicated: Secondary | ICD-10-CM | POA: Insufficient documentation

## 2024-06-19 DIAGNOSIS — Z85528 Personal history of other malignant neoplasm of kidney: Secondary | ICD-10-CM

## 2024-06-19 LAB — CBC WITH DIFFERENTIAL (CANCER CENTER ONLY)
Abs Immature Granulocytes: 0.02 K/uL (ref 0.00–0.07)
Basophils Absolute: 0.1 K/uL (ref 0.0–0.1)
Basophils Relative: 1 %
Eosinophils Absolute: 0.2 K/uL (ref 0.0–0.5)
Eosinophils Relative: 3 %
HCT: 39.3 % (ref 39.0–52.0)
Hemoglobin: 13.6 g/dL (ref 13.0–17.0)
Immature Granulocytes: 0 %
Lymphocytes Relative: 37 %
Lymphs Abs: 3.2 K/uL (ref 0.7–4.0)
MCH: 29.8 pg (ref 26.0–34.0)
MCHC: 34.6 g/dL (ref 30.0–36.0)
MCV: 86 fL (ref 80.0–100.0)
Monocytes Absolute: 0.6 K/uL (ref 0.1–1.0)
Monocytes Relative: 7 %
Neutro Abs: 4.4 K/uL (ref 1.7–7.7)
Neutrophils Relative %: 52 %
Platelet Count: 371 K/uL (ref 150–400)
RBC: 4.57 MIL/uL (ref 4.22–5.81)
RDW: 11.8 % (ref 11.5–15.5)
WBC Count: 8.5 K/uL (ref 4.0–10.5)
nRBC: 0 % (ref 0.0–0.2)

## 2024-06-19 LAB — CMP (CANCER CENTER ONLY)
ALT: 18 U/L (ref 0–44)
AST: 19 U/L (ref 15–41)
Albumin: 4.3 g/dL (ref 3.5–5.0)
Alkaline Phosphatase: 82 U/L (ref 38–126)
Anion gap: 10 (ref 5–15)
BUN: 14 mg/dL (ref 6–20)
CO2: 26 mmol/L (ref 22–32)
Calcium: 9.2 mg/dL (ref 8.9–10.3)
Chloride: 104 mmol/L (ref 98–111)
Creatinine: 0.89 mg/dL (ref 0.61–1.24)
GFR, Estimated: >60 mL/min (ref >60)
Glucose, Bld: 98 mg/dL (ref 70–99)
Potassium: 4.4 mmol/L (ref 3.5–5.1)
Sodium: 139 mmol/L (ref 135–145)
Total Bilirubin: 0.4 mg/dL (ref 0.0–1.2)
Total Protein: 6.6 g/dL (ref 6.5–8.1)

## 2024-06-19 LAB — LACTATE DEHYDROGENASE: LDH: 141 U/L (ref 98–192)

## 2024-06-19 NOTE — Progress Notes (Signed)
 Initial RN Navigator Patient Visit  Name: Oscar Anderson Date of Referral : 06/13/2024 Diagnosis: Clear Cell Renal Cancer  Met with patient prior to their visit with MD. Twyla patient Your Patient Navigator handout which explains my role, areas in which I am able to help, and all the contact information for myself and the office. Also gave patient MD and Navigator business card. Reviewed with patient the general overview of expected course after initial diagnosis and time frame for all steps to be completed.  New patient packet given to patient which includes: orientation to office and staff; campus directory; education on My Chart and Advance Directives; and patient centered education on kidney cancer.   Patient comes in with his wife. He lives with his brother. They have two small children who live with the maternal grandparents, but patient and wife spend most of their time there to help take care of a grandparent with Alzheimer's. Patient has been out of work since last October.   Referral to social work placed per protocol.   Patient completed visit with Dr. Timmy.   Patient will need MRI brain to complete staging. He currently has a pending bone scan for assessment of left rib pain. If scan is negative we will follow up in November after follow up scans.   If staging workup is negative, patient will just follow up on surveillance with no additional treatment needed.   Patient understands all follow up procedures and expectations. They have my number to reach out for any further clarification or additional needs.   Oncology Nurse Navigator Documentation     06/19/2024   11:15 AM  Oncology Nurse Navigator Flowsheets  Navigator Follow Up Date: 06/21/2024  Navigator Follow Up Reason: Scan Review  Navigator Location [facility]  Navigator Encounter Type Initial MedOnc  Patient Visit Type MedOnc  Treatment Phase Active Tx  Barriers/Navigation Needs Coordination of  Care;Education  Education Newly Diagnosed Cancer Education;Pain/ Symptom Management;Other  Interventions Education;Psycho-Social Support;Referrals  Acuity Level 2-Minimal Needs (1-2 Barriers Identified)  Referrals Social Work  Nature conservation officer Groups/Services Friends and Family  Time Spent with Patient 30  Genetic Counseling Type None

## 2024-06-19 NOTE — Progress Notes (Signed)
 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  HPI: Mr. Oscar Anderson is a very nice 38 year old white male.  He comes in with his wife.  They are very nice.  They live in [place].  He has had some issues.  He had some problems with I think chest discomfort recently.  He had no obvious change in bowel or bladder habits.  He apparently has polycystic kidneys.  He had scans that were done.  I am unsure if he was actually admitted.  I think he may have been admitted for the possibility what was felt to be chest pain.  He was found to have a tumor on his left kidney.  There was no obvious metastatic disease as far as until.  He subsequently underwent resection.  Had a partial nephrectomy.  This was done on 06/06/2024.  The path report ([id number]) showed a clear-cell renal cell carcinoma.  It measured 2.8 cm.  All margins were negative.  There is no lymphovascular space invasion.  There was no evidence of sarcomatoid or rhabdoid features.  The tumor was limited to the kidney.  The specimen was sent up to the [facility] for extra evaluation.  As such, it appears that this is a stage IA (T1NxM0) renal cell carcinoma of the LEFT kidney.  After surgery, he had problems.  He developed pneumonia.  Again I think he was hospitalized for several days.  He was told that he has some kind of mass on his left ribs.  He had a bone scan that was done yesterday.  This has not been read.  He does smoke.  He smoked since he was 38 years old.  He is to be a heavy drinker.  Used to do recreational drugs.  Now, he does smokes marijuana.  He still has an occasional beer and whiskey.  He does have occasional cigarette.  He was kindly referred to the [facility] for further recommendations.  He is quite healthy.  Right now, he is not  working.  Overall, I was that his performance status is probably ECOG 0.    Past Medical History:  Diagnosis Date   Asthma    Cancer of kidney parenchyma, left (HCC) 06/19/2024   Chronic back pain    Irregular heartbeat    as a child   Restless leg syndrome   :   Past Surgical History:  Procedure Laterality Date   KNEE SURGERY Right    ORCHIECTOMY Left 10/06/2022   Procedure: LEFT ORCHIECTOMY;  Surgeon: Carolee Sherwood JONETTA DOUGLAS, MD;  Location: [facility] OR;  Service: Urology;  Laterality: Left;   polycystic kidney disease     SCROTAL EXPLORATION N/A 10/06/2022   Procedure: SCROTUM EXPLORATION;  Surgeon: Carolee Sherwood JONETTA DOUGLAS, MD;  Location: [facility] OR;  Service: Urology;  Laterality: N/A;   Thumb surgery Left   :   Current Outpatient Medications:    albuterol (VENTOLIN HFA) 108 (90 Base) MCG/ACT inhaler, Inhale 2 puffs into the lungs., Disp: , Rfl:    ketorolac (TORADOL) 10 MG tablet, Take 10 mg by mouth every 6 (six) hours as needed., Disp: , Rfl:    polyethylene glycol (MIRALAX / GLYCOLAX) 17 g packet, Take 17 g by mouth daily as needed., Disp: , Rfl:    HYDROcodone -acetaminophen  (NORCO) 10-325 MG tablet, Take 1 tablet by mouth every  8 (eight) hours as needed for up to 5 days., Disp: 15 tablet, Rfl: 0   hydrOXYzine  (VISTARIL ) 25 MG capsule, Take 1 capsule (25 mg total) by mouth every 8 (eight) hours as needed for anxiety., Disp: 30 capsule, Rfl: 0   lurasidone  (LATUDA ) 40 MG TABS tablet, Take 1 tablet (40 mg total) by mouth daily with breakfast., Disp: 30 tablet, Rfl: 3:  :   Allergies  Allergen Reactions   Lamictal  [Lamotrigine ]     Psychosis, homicidal    Tramadol  Nausea Only, Rash, Dermatitis and Other (See Comments)    Restless leg  Pt states it makes him sweaty, itchy and sick   Cyclobenzaprine  Anxiety and Other (See Comments)    Causes restless leg syndrome  Causes restless leg syndrome, Restless leg  Restless legs   Lamotrigine  Other (See Comments)    Worsened manic  behavior  Worsened manic behavior, Pt states that it makes him homicidal, Worsened manic behavior   Toradol [Ketorolac Tromethamine] Itching    And swelling   Toradol [Ketorolac Tromethamine] Hives   Tramadol  Hcl Itching and Nausea And Vomiting   Tape Rash    Blister with silk tape  :  No family history on file.:   Social History   Socioeconomic History   Marital status: Married    Spouse name: Not on file   Number of children: Not on file   Years of education: Not on file   Highest education level: Not on file  Occupational History   Not on file  Tobacco Use   Smoking status: Every Day    Current packs/day: 1.00    Types: Cigarettes   Smokeless tobacco: Never  Vaping Use   Vaping status: Never Used  Substance and Sexual Activity   Alcohol use: Yes    Comment: daily   Drug use: Yes    Types: Marijuana    Comment: an hour ago   Sexual activity: Yes    Birth control/protection: None  Other Topics Concern   Not on file  Social History Narrative   ** Merged History Encounter **       Social Drivers of Health   Financial Resource Strain: Low Risk  (04/12/2024)   Received from [facility]   Overall Financial Resource Strain (CARDIA)    How hard is it for you to pay for the very basics like food, housing, medical care, and heating?: Not hard at all  Food Insecurity: Food Insecurity Present (06/19/2024)   Hunger Vital Sign    Worried About Running Out of Food in the Last Year: Often true    Ran Out of Food in the Last Year: Often true  Transportation Needs: Unmet Transportation Needs (06/07/2024)   Received from [facility] - Transportation    In the past 12 months, has lack of transportation kept you from medical appointments or from getting medications?: Yes    In the past 12 months, has lack of transportation kept you from meetings, work, or from getting things needed for daily living?: Yes  Physical Activity: Not on file  Stress: No Stress Concern  Present (06/06/2024)   Received from [facility] - Occupational Stress Questionnaire    Do you feel stress - tense, restless, nervous, or anxious, or unable to sleep at night because your mind is troubled all the time - these days?: Only a little  Social Connections: Unknown (01/29/2022)   Received from [facility]  Social Network    Social Network: Not on file  Intimate Partner Violence: Not At Risk (06/19/2024)   Humiliation, Afraid, Rape, and Kick questionnaire    Fear of Current or Ex-Partner: No    Emotionally Abused: No    Physically Abused: No    Sexually Abused: No  : Review of Systems  Constitutional: Negative.   HENT: Negative.    Eyes: Negative.   Respiratory: Negative.    Cardiovascular:  Positive for palpitations.  Gastrointestinal: Negative.   Genitourinary:  Positive for hematuria.  Musculoskeletal:  Positive for joint pain and myalgias.  Skin: Negative.   Neurological: Negative.   Endo/Heme/Allergies: Negative.   Psychiatric/Behavioral: Negative.       Exam:  Vital signs show temperature of 98.2.  Pulse 80.  Blood pressure 127/77.  Weight is 175 pounds.  @IPVITALS @ Physical Exam Vitals reviewed.  HENT:     Head: Normocephalic and atraumatic.  Eyes:     Pupils: Pupils are equal, round, and reactive to light.  Cardiovascular:     Rate and Rhythm: Normal rate and regular rhythm.     Heart sounds: Normal heart sounds.  Pulmonary:     Effort: Pulmonary effort is normal.     Breath sounds: Normal breath sounds.  Abdominal:     General: Bowel sounds are normal.     Palpations: Abdomen is soft.     Comments: Abdominal exam is soft.  He has laparoscopy scars that are healing.  He has no fluid wave.  There is no guarding or rebound tenderness.  There is no palpable liver or spleen tip.  Musculoskeletal:        General: No tenderness or deformity. Normal range of motion.     Cervical back: Normal range of motion.      Comments: I cannot palpate any swelling or actual tenderness in the ribs.  Lymphadenopathy:     Cervical: No cervical adenopathy.  Skin:    General: Skin is warm and dry.     Findings: No erythema or rash.  Neurological:     Mental Status: He is alert and oriented to person, place, and time.  Psychiatric:        Behavior: Behavior normal.        Thought Content: Thought content normal.        Judgment: Judgment normal.      Recent Labs    06/19/24 1125  WBC 8.5  HGB 13.6  HCT 39.3  PLT 371    Recent Labs    06/19/24 1125  NA 139  K 4.4  CL 104  CO2 26  GLUCOSE 98  BUN 14  CREATININE 0.89  CALCIUM 9.2    Blood smear review: None  Pathology: See above    Assessment and Plan: Oscar Anderson is a very nice 38 year old white male.  He has polycystic kidneys.  He has a renal cell carcinoma on his left kidney.  This was resected with a partial nephrectomy.  I would be surprised if he did have any evidence of metastatic disease.  We will see what the bone scan shows..  I do think that he probably does need to have a MRI of the brain as a staging test.  I think this is going to be very helpful.  I think this will help complete the staging.  He will need to be followed closely.  Because of his polycystic kidney, he is at risk for kidney cancer.  I probably would do another scan  on him.  I probably get an MRI of the kidney and probably CT of the chest.  I think this is the best way for us  to try to follow him up.  Again I think that the risk of recurrence for his stage I malignancy should be less than 10%.  I do not see that he needs any adjuvant therapy.  I would not consider him at high risk for recurrence.  Obviously, if we do find that there is an abnormality on the bone scan, we will have to do some more testing.  Will see about getting his MRI of the brain in a couple weeks.  I would then plan to do of the CT scans probably in a few months.  It was very nice  talking to he and his wife.  They are both very very nice.

## 2024-06-19 NOTE — Telephone Encounter (Signed)
 Attempted call to [facility] but call was disconnected.

## 2024-06-19 NOTE — Telephone Encounter (Signed)
 Are you able to do that? It may be helpful... thanks

## 2024-06-20 ENCOUNTER — Encounter: Payer: Self-pay | Admitting: *Deleted

## 2024-06-20 ENCOUNTER — Inpatient Hospital Stay: Attending: Hematology & Oncology

## 2024-06-20 NOTE — Progress Notes (Signed)
 [facility] Clinical Social Work  Clinical Social Work was referred by Statistician for assessment of psychosocial needs.  Clinical Social Worker contacted patient by phone to offer support and assess for needs.     Interventions: Provided patient with information about CSW role including, counseling, resources and disability referrals.  Patient stated he was interested in applying for Social Security Disability.  CSW obtained information and will refer to the [facility].  Confirmed patient was aware he was pending an MRI before moving forward.       Follow Up Plan:  CSW will follow-up with patient by phone     Oscar CHRISTELLA Au, LCSW  Clinical Social Worker [facility]

## 2024-06-20 NOTE — Telephone Encounter (Signed)
 Results have been received

## 2024-06-21 NOTE — Progress Notes (Signed)
 Called and notified patient of good bone scan result. He will now follow up in November after repeat scans. He does need an MRI Brain to complete staging. This is still pending with insurance. [facility] will reach out to schedule once auth obtained. Patient understands this.   Oncology Nurse Navigator Documentation     06/20/2024   10:30 AM  Oncology Nurse Navigator Flowsheets  Navigator Follow Up Date: 06/26/2024  Navigator Follow Up Reason: Appointment Review  Navigator Location [facility]  Navigator Encounter Type Telephone  Telephone Outgoing Call;Diagnostic Results  Patient Visit Type MedOnc  Treatment Phase Post-Tx Follow-up  Barriers/Navigation Needs Coordination of Care;Education  Education Other  Interventions Education  Acuity Level 2-Minimal Needs (1-2 Barriers Identified)  Education Method Verbal  Support Groups/Services Friends and Family  Time Spent with Patient 15

## 2024-06-22 ENCOUNTER — Ambulatory Visit: Payer: Self-pay | Admitting: Urgent Care

## 2024-06-22 MED ORDER — OXYCODONE-ACETAMINOPHEN 10-325 MG PO TABS: 1.0000 | ORAL_TABLET | Freq: Three times a day (TID) | ORAL | 0 refills | Status: AC | PRN

## 2024-06-26 ENCOUNTER — Encounter: Payer: Self-pay | Admitting: *Deleted

## 2024-06-26 NOTE — Progress Notes (Signed)
 MRI scheduled for 07/02/2024.  Oncology Nurse Navigator Documentation     06/26/2024    7:45 AM  Oncology Nurse Navigator Flowsheets  Navigator Follow Up Date: 07/02/2024  Navigator Follow Up Reason: Scan Review  Navigator Location [facility]  Navigator Encounter Type Appt/Treatment Plan Review  Patient Visit Type MedOnc  Treatment Phase Post-Tx Follow-up  Barriers/Navigation Needs Coordination of Care;Education  Interventions None Required  Acuity Level 2-Minimal Needs (1-2 Barriers Identified)  Support Groups/Services Friends and Family  Time Spent with Patient 15

## 2024-07-02 ENCOUNTER — Ambulatory Visit (INDEPENDENT_AMBULATORY_CARE_PROVIDER_SITE_OTHER): Admitting: Licensed Clinical Social Worker

## 2024-07-02 ENCOUNTER — Ambulatory Visit

## 2024-07-02 DIAGNOSIS — Z85528 Personal history of other malignant neoplasm of kidney: Secondary | ICD-10-CM

## 2024-07-02 DIAGNOSIS — F3181 Bipolar II disorder: Secondary | ICD-10-CM | POA: Diagnosis not present

## 2024-07-02 DIAGNOSIS — F1220 Cannabis dependence, uncomplicated: Secondary | ICD-10-CM

## 2024-07-02 DIAGNOSIS — F411 Generalized anxiety disorder: Secondary | ICD-10-CM | POA: Diagnosis not present

## 2024-07-02 MED ORDER — GADOBUTROL 1 MMOL/ML IV SOLN
7.5000 mL | Freq: Once | INTRAVENOUS | Status: AC | PRN
  Administered 2024-07-02: 7.5 mL via INTRAVENOUS
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
## 2024-07-04 ENCOUNTER — Ambulatory Visit: Payer: Self-pay | Admitting: Hematology & Oncology

## 2024-07-04 ENCOUNTER — Encounter: Payer: Self-pay | Admitting: *Deleted

## 2024-07-04 NOTE — Progress Notes (Signed)
 Reviewed MRI which is negative for malignancy. He still needs CT scans scheduled. Will follow up closer to that date.  Oncology Nurse Navigator Documentation     07/04/2024    2:15 PM  Oncology Nurse Navigator Flowsheets  Navigator Follow Up Date: 07/30/2024  Navigator Follow Up Reason: Radiology  Navigator Location [facility]  Navigator Encounter Type Scan Review  Patient Visit Type MedOnc  Treatment Phase Post-Tx Follow-up  Barriers/Navigation Needs Coordination of Care;Education  Interventions None Required  Acuity Level 2-Minimal Needs (1-2 Barriers Identified)  Support Groups/Services Friends and Family  Time Spent with Patient 15

## 2024-07-09 ENCOUNTER — Inpatient Hospital Stay

## 2024-07-09 NOTE — Progress Notes (Signed)
 [facility] CSW Progress Note  Clinical Child psychotherapist contacted caregiver by phone to follow-up on caregiver/family issues.    Intervention CSW spoke with patient's wife, Oscar Anderson, who stated patient was currently incarcerated at [facility].  She said their staff advised her to contact [facility] regarding patient's medical care.  CSW sent a request to Dr. Jessy RN for assistance.      Follow Up Plan:  CSW will follow-up with patient by phone     Oscar CHRISTELLA Au, LCSW Clinical Social Worker [facility]

## 2024-07-10 ENCOUNTER — Telehealth: Payer: Self-pay

## 2024-07-10 NOTE — Telephone Encounter (Signed)
 Clinical Social Work attempted to contact patient's wife, Chyrl by phone.  Left voicemail with contact information and request for return call.

## 2024-07-16 ENCOUNTER — Encounter: Payer: Self-pay | Admitting: Urgent Care

## 2024-07-24 ENCOUNTER — Ambulatory Visit (HOSPITAL_COMMUNITY): Admitting: Licensed Clinical Social Worker

## 2024-07-26 ENCOUNTER — Encounter: Payer: Self-pay | Admitting: *Deleted

## 2024-07-26 NOTE — Progress Notes (Signed)
 Patient had still not scheduled CT scans. I called and was able to speak to his wife. Patient has recently been incarcerated. At this time, his release is unknown but the family is unable to pay the bond to have him released. His trial date has not yet been set and he will likely be in jail for the foreseeable future. She asks if we can speak to the [facility] and see if they would be willing to allow patient to have his scans.   Called [phone]. Spoke to Alexander. She states that medical records need to be sent to show medical need for any medical care the patient requires. This will then be given to the department NP, Adam, who will then make a determination on whether or not the patient is eligible for follow up. Records faxed to [phone]. My direct contact left. Adam should follow up with me once decision is made.   Oncology Nurse Navigator Documentation     07/26/2024    8:30 AM  Oncology Nurse Navigator Flowsheets  Navigator Follow Up Date: 07/27/2024  Navigator Follow Up Reason: Other:  Navigator Location [facility]  Navigator Encounter Type Appt/Treatment Plan Review  Patient Visit Type MedOnc  Treatment Phase Post-Tx Follow-up  Barriers/Navigation Needs Coordination of Care;Education  Interventions Coordination of Care  Acuity Level 2-Minimal Needs (1-2 Barriers Identified)  Coordination of Care Other  Support Groups/Services Friends and Family  Time Spent with Patient 60

## 2024-07-30 ENCOUNTER — Telehealth: Payer: Self-pay | Admitting: Urgent Care

## 2024-07-30 NOTE — Telephone Encounter (Unsigned)
 Copied from CRM [id number]. Topic: Clinical - Medical Advice >> Jul 30, 2024  3:11 PM Dominique A wrote: Reason for CRM: Chyrl patients wife called in to see if the patient can get a prescription for gabapentin . Patient is currently incarcerated at [facility] [address] [address] [phone]  Per Chyrl patient has a history of health problems and is having pain all over. Patient has restless leg syndrome and it is acting up and is having pain in his back. It could be due to surgery. Please advise and call Lynsey back.   [phone]

## 2024-07-31 ENCOUNTER — Encounter: Payer: Self-pay | Admitting: *Deleted

## 2024-07-31 NOTE — Telephone Encounter (Signed)
 Could someone please call the [facility] to confirm this? I spoke with the [facility] police officer at the clinic today who stated that any medications needed while at the detention center will be prescribed and provided to patient while there. He should not need a script from me until after he is released.

## 2024-07-31 NOTE — Progress Notes (Signed)
 Still have not received communication from [facility] regarding the request I made last week. Called today and had to leave a voicemail requesting a call back.   Called patient's wife and updated her to my attempts. Voicemail left.   Oncology Nurse Navigator Documentation     07/31/2024    1:00 PM  Oncology Nurse Navigator Flowsheets  Navigator Location [facility]  Navigator Encounter Type Telephone  Telephone Outgoing Call  Patient Visit Type MedOnc  Treatment Phase Post-Tx Follow-up  Barriers/Navigation Needs Coordination of Care;Education  Interventions Coordination of Care  Acuity Level 2-Minimal Needs (1-2 Barriers Identified)  Coordination of Care Other  Support Groups/Services Friends and Family  Time Spent with Patient 15

## 2024-08-01 NOTE — Telephone Encounter (Signed)
 To PCP

## 2024-08-01 NOTE — Telephone Encounter (Signed)
 Attempted call to [facility]  [phone]. - was connected to the nurse on site who said that any medications needed for the patient should be supplied by their facility and she was going to check to see if they could receive an outside prescription for this from the provider. Was connected to the voicemail of the head nurse. Left a voice mail message requesting a return call.

## 2024-08-02 ENCOUNTER — Ambulatory Visit (HOSPITAL_COMMUNITY): Admitting: Licensed Clinical Social Worker

## 2024-08-02 ENCOUNTER — Encounter (HOSPITAL_COMMUNITY): Payer: Self-pay | Admitting: Licensed Clinical Social Worker

## 2024-08-08 ENCOUNTER — Ambulatory Visit (HOSPITAL_COMMUNITY): Admitting: Licensed Clinical Social Worker

## 2024-08-15 ENCOUNTER — Ambulatory Visit (HOSPITAL_COMMUNITY): Admitting: Licensed Clinical Social Worker

## 2024-08-22 ENCOUNTER — Inpatient Hospital Stay

## 2024-08-22 ENCOUNTER — Inpatient Hospital Stay: Admitting: Hematology & Oncology
